# Patient Record
Sex: Female | Born: 1995 | Race: White | Hispanic: No | Marital: Single | Smoking: Current every day smoker
Health system: Southern US, Community
[De-identification: ages and names within clinical notes are randomized; demographics above are authoritative.]

## PROBLEM LIST (undated history)

## (undated) ENCOUNTER — Inpatient Hospital Stay (HOSPITAL_COMMUNITY): Payer: Self-pay

## (undated) ENCOUNTER — Inpatient Hospital Stay: Payer: Self-pay

## (undated) DIAGNOSIS — I519 Heart disease, unspecified: Secondary | ICD-10-CM

## (undated) DIAGNOSIS — Z87440 Personal history of urinary (tract) infections: Secondary | ICD-10-CM

## (undated) DIAGNOSIS — R51 Headache: Secondary | ICD-10-CM

## (undated) DIAGNOSIS — Z8619 Personal history of other infectious and parasitic diseases: Secondary | ICD-10-CM

## (undated) DIAGNOSIS — J45909 Unspecified asthma, uncomplicated: Secondary | ICD-10-CM

## (undated) DIAGNOSIS — O2300 Infections of kidney in pregnancy, unspecified trimester: Secondary | ICD-10-CM

## (undated) DIAGNOSIS — D649 Anemia, unspecified: Secondary | ICD-10-CM

## (undated) DIAGNOSIS — R519 Headache, unspecified: Secondary | ICD-10-CM

## (undated) DIAGNOSIS — R42 Dizziness and giddiness: Secondary | ICD-10-CM

## (undated) DIAGNOSIS — Z8759 Personal history of other complications of pregnancy, childbirth and the puerperium: Secondary | ICD-10-CM

---
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
## 2013-01-01 ENCOUNTER — Encounter (HOSPITAL_COMMUNITY): Payer: Self-pay | Admitting: *Deleted

## 2013-01-01 ENCOUNTER — Inpatient Hospital Stay (HOSPITAL_COMMUNITY)
Admission: AD | Admit: 2013-01-01 | Discharge: 2013-01-02 | Disposition: A | Discharge status: Home / Self Care | Payer: Medicaid Other | Source: Ambulatory Visit | Attending: Obstetrics and Gynecology | Admitting: Obstetrics and Gynecology

## 2013-01-01 ENCOUNTER — Inpatient Hospital Stay (HOSPITAL_COMMUNITY): Payer: Medicaid Other

## 2013-01-01 DIAGNOSIS — O26899 Other specified pregnancy related conditions, unspecified trimester: Secondary | ICD-10-CM

## 2013-01-01 DIAGNOSIS — K219 Gastro-esophageal reflux disease without esophagitis: Secondary | ICD-10-CM

## 2013-01-01 DIAGNOSIS — R109 Unspecified abdominal pain: Secondary | ICD-10-CM | POA: Insufficient documentation

## 2013-01-01 DIAGNOSIS — O99891 Other specified diseases and conditions complicating pregnancy: Secondary | ICD-10-CM | POA: Insufficient documentation

## 2013-01-01 DIAGNOSIS — O4703 False labor before 37 completed weeks of gestation, third trimester: Secondary | ICD-10-CM

## 2013-01-01 DIAGNOSIS — O4700 False labor before 37 completed weeks of gestation, unspecified trimester: Secondary | ICD-10-CM | POA: Insufficient documentation

## 2013-01-01 LAB — CBC
HCT: 37 % (ref 36.0–46.0)
Hemoglobin: 12.8 g/dL (ref 12.0–15.0)
MCH: 28.9 pg (ref 26.0–34.0)
MCHC: 34.6 g/dL (ref 30.0–36.0)
MCV: 83.5 fL (ref 78.0–100.0)
Platelets: 265 10*3/uL (ref 150–400)
RBC: 4.43 MIL/uL (ref 3.87–5.11)
RDW: 12.5 % (ref 11.5–15.5)
WBC: 13.6 10*3/uL — ABNORMAL HIGH (ref 4.0–10.5)

## 2013-01-01 LAB — COMPREHENSIVE METABOLIC PANEL
ALT: 11 U/L (ref 0–35)
AST: 12 U/L (ref 0–37)
Albumin: 3.2 g/dL — ABNORMAL LOW (ref 3.5–5.2)
Alkaline Phosphatase: 160 U/L — ABNORMAL HIGH (ref 39–117)
BUN: 6 mg/dL (ref 6–23)
CO2: 24 mEq/L (ref 19–32)
Calcium: 9.2 mg/dL (ref 8.4–10.5)
Chloride: 102 mEq/L (ref 96–112)
Creatinine, Ser: 0.49 mg/dL — ABNORMAL LOW (ref 0.50–1.10)
GFR calc Af Amer: >90 mL/min (ref >90)
GFR calc non Af Amer: >90 mL/min (ref >90)
Glucose, Bld: 87 mg/dL (ref 70–99)
Potassium: 3.9 mEq/L (ref 3.5–5.1)
Sodium: 136 mEq/L (ref 135–145)
Total Bilirubin: 0.3 mg/dL (ref 0.3–1.2)
Total Protein: 6.9 g/dL (ref 6.0–8.3)

## 2013-01-01 LAB — URINALYSIS, ROUTINE W REFLEX MICROSCOPIC
Hgb urine dipstick: NEGATIVE
Leukocytes, UA: NEGATIVE
Protein, ur: NEGATIVE mg/dL
Urobilinogen, UA: 0.2 mg/dL (ref 0.0–1.0)

## 2013-01-01 LAB — LIPASE, BLOOD: Lipase: 21 U/L (ref 11–59)

## 2013-01-01 LAB — POCT FERN TEST

## 2013-01-01 LAB — AMYLASE: Amylase: 39 U/L (ref 0–105)

## 2013-01-01 MED ORDER — GI COCKTAIL ~~LOC~~
30.0000 mL | Freq: Once | ORAL | Status: AC
  Administered 2013-01-01: 30 mL via ORAL
  Filled 2013-01-01: qty 30

## 2013-01-01 MED ORDER — LACTATED RINGERS IV BOLUS (SEPSIS)
1000.0000 mL | Freq: Once | INTRAVENOUS | Status: AC
  Administered 2013-01-02: 1000 mL via INTRAVENOUS

## 2013-01-01 MED ORDER — OXYCODONE-ACETAMINOPHEN 5-325 MG PO TABS: 2.0000 | ORAL_TABLET | Freq: Once | ORAL | Status: DC

## 2013-01-01 MED ORDER — NIFEDIPINE 10 MG PO CAPS
10.0000 mg | ORAL_CAPSULE | ORAL | Status: DC | PRN
  Administered 2013-01-01 (×3): 10 mg via ORAL
  Filled 2013-01-01 (×3): qty 1

## 2013-01-01 MED ORDER — TRAMADOL HCL 50 MG PO TABS
100.0000 mg | ORAL_TABLET | Freq: Once | ORAL | Status: AC | PRN
  Administered 2013-01-01: 100 mg via ORAL
  Filled 2013-01-01: qty 2

## 2013-01-01 NOTE — MAU Provider Note (Signed)
Chief Complaint:  Contractions  First Provider Initiated Contact with Patient 01/01/13 2106     HPI: Ruth Gross is a 17 y.o. G2P0010 at [redacted]w[redacted]d who presents to maternity admissions reporting worsening upper abd pain over the past week, 2-3 UC's per hour and leaking small amounts of thin, white fluid x2 days. Seen in MAU 12/25/2012 for contractions and upper abdominal pain. Cervix closed. Fetal fibronectin positive. Wet prep and GC Chlamydia negative. Betamethasone x2. Prescribed Procardia which she has not taken for over a day. Essentially normal CBC and CMET. No evidence of preeclampsia. Good fetal movement.   Past Medical History: Past Medical History  Diagnosis Date  . No pertinent past medical history   . Anemia     Past obstetric history: OB History  Gravida Para Term Preterm AB SAB TAB Ectopic Multiple Living  2    1 1     0    # Outcome Date GA Lbr Len/2nd Weight Sex Delivery Anes PTL Lv  2 CUR           1 SAB 04/09/12              Past Surgical History: Past Surgical History  Procedure Laterality Date  . No past surgeries    . Dilation and evacuation  04/10/2012    Procedure: DILATATION AND EVACUATION;  Surgeon: Mickel Baas, MD;  Location: [facility];  Service: Gynecology;  Laterality: N/A;  . Dilation and evacuation N/A 06/29/2012    Procedure: DILATATION AND EVACUATION;  Surgeon: Mickel Baas, MD;  Location: [facility];  Service: Gynecology;  Laterality: N/A;     Family History: Family History  Problem Relation Age of Onset  . Other Neg Hx   . Diabetes Father   . Diabetes Sister     Social History: History  Substance Use Topics  . Smoking status: Never Smoker   . Smokeless tobacco: Not on file  . Alcohol Use: No    Allergies: No Known Allergies  Meds:  Prescriptions prior to admission  Medication Sig Dispense Refill  . ferrous sulfate 325 (65 FE) MG tablet Take 1 tablet (325 mg total) by mouth 2 (two) times daily.  60 tablet  5  . NIFEdipine  (PROCARDIA) 10 MG capsule Take 1 capsule (10 mg total) by mouth 3 (three) times daily.  90 capsule  1  . Prenatal Vit-Fe Fumarate-FA (PRENATAL MULTIVITAMIN) TABS Take 1 tablet by mouth at bedtime.        ROS: Positive for contractions, epigastric pain, leaking, urinary frequency and nausea. Negative for fever, chills, headache, vision changes, uterine tenderness, vaginal bleeding, vaginal odor, dysuria, urgency, flank pain, vomiting, diarrhea or constipation. Last bowel movement today. Last intercourse more than one week ago.  Physical Exam  Blood pressure 120/69, pulse 90, temperature 98.9 F (37.2 C), temperature source Oral, resp. rate 18, height 5\' 3"  (1.6 m), weight 81.285 kg (179 lb 3.2 oz), last menstrual period 05/10/2012, SpO2 100.00%. GENERAL: Well-developed, well-nourished female in mild distress.  HEENT: normocephalic HEART: normal rate RESP: normal effort ABDOMEN: Soft, moderate tenderness across upper abd. Gravid appropriate for gestational age. Uterus NT.  EXTREMITIES: Nontender, no edema NEURO: alert and oriented SPECULUM EXAM: NEFG, physiologic discharge, no odor, neg pooling, no blood, cervix clean   Dilation: Fingertip Effacement (%): 50 Cervical Position: Middle Station: Ballotable Exam by:: [employer] CNM   FHT:  Baseline 150 , moderate variability, accelerations present, no decelerations Contractions: Rare initially, but increased to q 3-5 mins, mild-moderate  Labs: Results for orders placed during the hospital encounter of 01/01/13 (from the past 24 hour(s))  URINALYSIS, ROUTINE W REFLEX MICROSCOPIC     Status: None   Collection Time    01/01/13  8:26 PM      Result Value Range   Color, Urine YELLOW  YELLOW   APPearance CLEAR  CLEAR   Specific Gravity, Urine 1.020  1.005 - 1.030   pH 6.0  5.0 - 8.0   Glucose, UA NEGATIVE  NEGATIVE mg/dL   Hgb urine dipstick NEGATIVE  NEGATIVE   Bilirubin Urine NEGATIVE  NEGATIVE   Ketones, ur NEGATIVE  NEGATIVE  mg/dL   Protein, ur NEGATIVE  NEGATIVE mg/dL   Urobilinogen, UA 0.2  0.0 - 1.0 mg/dL   Nitrite NEGATIVE  NEGATIVE   Leukocytes, UA NEGATIVE  NEGATIVE  CBC     Status: Abnormal   Collection Time    01/01/13 10:12 PM      Result Value Range   WBC 13.6 (*) 4.0 - 10.5 K/uL   RBC 4.43  3.87 - 5.11 MIL/uL   Hemoglobin 12.8  12.0 - 15.0 g/dL   HCT 45.4  09.8 - 11.9 %   MCV 83.5  78.0 - 100.0 fL   MCH 28.9  26.0 - 34.0 pg   MCHC 34.6  30.0 - 36.0 g/dL   RDW 14.7  82.9 - 56.2 %   Platelets 265  150 - 400 K/uL  COMPREHENSIVE METABOLIC PANEL     Status: Abnormal   Collection Time    01/01/13 10:12 PM      Result Value Range   Sodium 136  135 - 145 mEq/L   Potassium 3.9  3.5 - 5.1 mEq/L   Chloride 102  96 - 112 mEq/L   CO2 24  19 - 32 mEq/L   Glucose, Bld 87  70 - 99 mg/dL   BUN 6  6 - 23 mg/dL   Creatinine, Ser 1.30 (*) 0.50 - 1.10 mg/dL   Calcium 9.2  8.4 - 86.5 mg/dL   Total Protein 6.9  6.0 - 8.3 g/dL   Albumin 3.2 (*) 3.5 - 5.2 g/dL   AST 12  0 - 37 U/L   ALT 11  0 - 35 U/L   Alkaline Phosphatase 160 (*) 39 - 117 U/L   Total Bilirubin 0.3  0.3 - 1.2 mg/dL   GFR calc non Af Amer >90  >90 mL/min   GFR calc Af Amer >90  >90 mL/min  POCT FERN TEST     Status: Normal   Collection Time    01/01/13 10:55 PM      Result Value Range   POCT Fern Test Negative      Imaging:  US Abdomen Limited Ruq  01/01/2013   CLINICAL DATA:  Epigastric pain.  Thirty-three weeks pregnant.  EXAM: US ABDOMEN LIMITED - RIGHT UPPER QUADRANT  COMPARISON:  None.  FINDINGS: Gallbladder  No gallstones or wall thickening visualized. No sonographic Murphy sign noted.  Common bile duct  Diameter: 1.6 mm, normal.  Liver:  No focal lesion identified. Within normal limits in parenchymal echogenicity.  IMPRESSION: Normal ultrasound appearance of the visualized right upper quadrant organs.   Electronically Signed   By: Burman Nieves M.D.   On: 01/01/2013 22:58   MAU Course: Procardia given. Right upper quadrant  ultrasound ordered, CBC and CMET ordered per consult with Dr. Tenny Craw.  Normal right upper quadrant ultrasound. GI cocktail ordered.  2343: No improvement in upper  abd pain after GI cocktail. Minimal decrease in pain or contractions after 3 Procardia doses, but significant increase in frequency. No cervical change. Will order IV fluids and Percocet.  Care of pt turned over to [facility], CNM  [place], [place] 01/01/2013 8:57 PM  Percocet and GI cocktail provided decrease in pain, pain not completely resolved.  Cervix rechecked--unchanged during MAU visit.  A: 1. Preterm contractions, third trimester   2. Acid reflux   3. Abdominal pain in pregnancy    P: Consult Dr Tenny Craw D/C home with PTL precautions Percocet 5/325, take 1-2 tabs Q 4 hours PRN, x10 tabs Zantac 150 mg OTC BID Keep appointment in office tomorrow Return to MAU as needed  Sharen Counter Certified Nurse-Midwife

## 2013-01-01 NOTE — MAU Note (Signed)
2-3 painful contractions per hour since last night. Denies vaginal bleeding. Has been leaking fluid that is sometime clear & sometime yellow, switches between watery and mucous like; x 2 days. Positive fetal movement. More than a week since last intercourse.

## 2013-01-02 DIAGNOSIS — O479 False labor, unspecified: Secondary | ICD-10-CM

## 2013-01-02 LAB — DIFFERENTIAL
Eosinophils Absolute: 0.1 10*3/uL (ref 0.0–0.7)
Eosinophils Relative: 1 % (ref 0–5)
Lymphocytes Relative: 20 % (ref 12–46)
Lymphs Abs: 2.7 10*3/uL (ref 0.7–4.0)
Monocytes Absolute: 0.7 10*3/uL (ref 0.1–1.0)
Monocytes Relative: 5 % (ref 3–12)

## 2013-01-02 MED ORDER — OXYCODONE-ACETAMINOPHEN 5-325 MG PO TABS
2.0000 | ORAL_TABLET | Freq: Once | ORAL | Status: AC
  Administered 2013-01-02: 2 via ORAL
  Filled 2013-01-02: qty 2

## 2013-01-02 MED ORDER — OXYCODONE-ACETAMINOPHEN 5-325 MG PO TABS: 2.0000 | ORAL_TABLET | Freq: Once | ORAL | Status: DC

## 2013-01-03 ENCOUNTER — Telehealth: Payer: Self-pay | Admitting: *Deleted

## 2013-01-03 MED ORDER — OXYCODONE-ACETAMINOPHEN 5-325 MG PO TABS: ORAL_TABLET | ORAL | Status: DC

## 2013-01-03 NOTE — Telephone Encounter (Signed)
Message left by [facility] pharmacist stating he has a question about a prescription written by Sharen Counter CNM on 01/02/13. I returned the call and he stated that the Rx for Percocet is written to take 2 tabs once, yet the quantity is #10 tabs. Per Lisa's note, she wanted pt to take 1-2 tabs q4h prn. I provided this clarification and updated the Rx in pt's EMR.

## 2013-02-09 ENCOUNTER — Inpatient Hospital Stay (HOSPITAL_COMMUNITY)
Admission: AD | Admit: 2013-02-09 | Discharge: 2013-02-13 | DRG: 766 | Disposition: A | Discharge status: Home / Self Care | Payer: Medicaid Other | Source: Ambulatory Visit | Attending: Obstetrics and Gynecology | Admitting: Obstetrics and Gynecology

## 2013-02-09 ENCOUNTER — Encounter (HOSPITAL_COMMUNITY): Payer: Self-pay | Admitting: *Deleted

## 2013-02-09 DIAGNOSIS — Z3480 Encounter for supervision of other normal pregnancy, unspecified trimester: Secondary | ICD-10-CM

## 2013-02-09 DIAGNOSIS — O34219 Maternal care for unspecified type scar from previous cesarean delivery: Secondary | ICD-10-CM

## 2013-02-09 DIAGNOSIS — O4290 Premature rupture of membranes, unspecified as to length of time between rupture and onset of labor, unspecified weeks of gestation: Secondary | ICD-10-CM | POA: Diagnosis present

## 2013-02-09 DIAGNOSIS — Z3490 Encounter for supervision of normal pregnancy, unspecified, unspecified trimester: Secondary | ICD-10-CM

## 2013-02-09 LAB — CBC
Hemoglobin: 13.1 g/dL (ref 12.0–15.0)
MCV: 82.1 fL (ref 78.0–100.0)
Platelets: 218 10*3/uL (ref 150–400)
RBC: 4.64 MIL/uL (ref 3.87–5.11)
WBC: 9.9 10*3/uL (ref 4.0–10.5)

## 2013-02-09 LAB — OB RESULTS CONSOLE GC/CHLAMYDIA
Chlamydia: NEGATIVE
Gonorrhea: NEGATIVE

## 2013-02-09 LAB — OB RESULTS CONSOLE RUBELLA ANTIBODY, IGM: Rubella: IMMUNE

## 2013-02-09 LAB — OB RESULTS CONSOLE HIV ANTIBODY (ROUTINE TESTING): HIV: NONREACTIVE

## 2013-02-09 LAB — OB RESULTS CONSOLE RPR: RPR: REACTIVE

## 2013-02-09 LAB — OB RESULTS CONSOLE HEPATITIS B SURFACE ANTIGEN: Hepatitis B Surface Ag: NEGATIVE

## 2013-02-09 MED ORDER — OXYTOCIN 40 UNITS IN LACTATED RINGERS INFUSION - SIMPLE MED
62.5000 mL/h | INTRAVENOUS | Status: DC
  Filled 2013-02-09: qty 1000

## 2013-02-09 MED ORDER — FLEET ENEMA 7-19 GM/118ML RE ENEM: 1.0000 | ENEMA | RECTAL | Status: DC | PRN

## 2013-02-09 MED ORDER — ONDANSETRON HCL 4 MG/2ML IJ SOLN
4.0000 mg | Freq: Four times a day (QID) | INTRAMUSCULAR | Status: DC | PRN
  Administered 2013-02-10: 4 mg via INTRAVENOUS
  Filled 2013-02-09: qty 2

## 2013-02-09 MED ORDER — DIPHENHYDRAMINE HCL 50 MG/ML IJ SOLN: 12.5000 mg | INTRAMUSCULAR | Status: DC | PRN

## 2013-02-09 MED ORDER — CITRIC ACID-SODIUM CITRATE 334-500 MG/5ML PO SOLN
30.0000 mL | ORAL | Status: DC | PRN
  Administered 2013-02-10: 30 mL via ORAL
  Filled 2013-02-09: qty 15

## 2013-02-09 MED ORDER — OXYTOCIN BOLUS FROM INFUSION: 500.0000 mL | INTRAVENOUS | Status: DC

## 2013-02-09 MED ORDER — EPHEDRINE 5 MG/ML INJ: 10.0000 mg | INTRAVENOUS | Status: DC | PRN

## 2013-02-09 MED ORDER — PHENYLEPHRINE 40 MCG/ML (10ML) SYRINGE FOR IV PUSH (FOR BLOOD PRESSURE SUPPORT): 80.0000 ug | PREFILLED_SYRINGE | INTRAVENOUS | Status: DC | PRN

## 2013-02-09 MED ORDER — LACTATED RINGERS IV SOLN
INTRAVENOUS | Status: DC
  Administered 2013-02-09 – 2013-02-10 (×6): via INTRAVENOUS

## 2013-02-09 MED ORDER — FENTANYL 2.5 MCG/ML BUPIVACAINE 1/10 % EPIDURAL INFUSION (WH - ANES)
14.0000 mL/h | INTRAMUSCULAR | Status: DC | PRN
  Administered 2013-02-10: 14 mL/h via EPIDURAL
  Filled 2013-02-09: qty 125

## 2013-02-09 MED ORDER — LACTATED RINGERS IV SOLN: 500.0000 mL | INTRAVENOUS | Status: DC | PRN

## 2013-02-09 MED ORDER — EPHEDRINE 5 MG/ML INJ
10.0000 mg | INTRAVENOUS | Status: DC | PRN
  Filled 2013-02-09: qty 4

## 2013-02-09 MED ORDER — LIDOCAINE HCL (PF) 1 % IJ SOLN: 30.0000 mL | INTRAMUSCULAR | Status: DC | PRN

## 2013-02-09 MED ORDER — OXYTOCIN 40 UNITS IN LACTATED RINGERS INFUSION - SIMPLE MED
1.0000 m[IU]/min | INTRAVENOUS | Status: DC
  Administered 2013-02-09 – 2013-02-10 (×2): 2 m[IU]/min via INTRAVENOUS

## 2013-02-09 MED ORDER — ACETAMINOPHEN 325 MG PO TABS: 650.0000 mg | ORAL_TABLET | ORAL | Status: DC | PRN

## 2013-02-09 MED ORDER — TERBUTALINE SULFATE 1 MG/ML IJ SOLN: 0.2500 mg | Freq: Once | INTRAMUSCULAR | Status: AC | PRN

## 2013-02-09 MED ORDER — LACTATED RINGERS IV SOLN: 500.0000 mL | Freq: Once | INTRAVENOUS | Status: DC

## 2013-02-09 MED ORDER — IBUPROFEN 600 MG PO TABS: 600.0000 mg | ORAL_TABLET | Freq: Four times a day (QID) | ORAL | Status: DC | PRN

## 2013-02-09 MED ORDER — OXYCODONE-ACETAMINOPHEN 5-325 MG PO TABS
1.0000 | ORAL_TABLET | ORAL | Status: DC | PRN
  Administered 2013-02-10: 1 via ORAL
  Filled 2013-02-09: qty 1

## 2013-02-09 MED ORDER — PHENYLEPHRINE 40 MCG/ML (10ML) SYRINGE FOR IV PUSH (FOR BLOOD PRESSURE SUPPORT)
80.0000 ug | PREFILLED_SYRINGE | INTRAVENOUS | Status: DC | PRN
  Filled 2013-02-09: qty 10

## 2013-02-09 NOTE — MAU Note (Signed)
Pt reprots she had a gush of fluid at 1345 today clear fluid. Pt reports some ctx about 8-9 min. Good fetal movement.

## 2013-02-10 ENCOUNTER — Encounter (HOSPITAL_COMMUNITY): Payer: Medicaid Other | Admitting: Anesthesiology

## 2013-02-10 ENCOUNTER — Encounter (HOSPITAL_COMMUNITY): Payer: Self-pay | Admitting: *Deleted

## 2013-02-10 ENCOUNTER — Encounter (HOSPITAL_COMMUNITY)
Admission: AD | Disposition: A | Discharge status: Home / Self Care | Payer: Self-pay | Source: Ambulatory Visit | Attending: Obstetrics and Gynecology

## 2013-02-10 ENCOUNTER — Inpatient Hospital Stay (HOSPITAL_COMMUNITY): Payer: Medicaid Other | Admitting: Anesthesiology

## 2013-02-10 ENCOUNTER — Inpatient Hospital Stay (HOSPITAL_COMMUNITY): Payer: Medicaid Other

## 2013-02-10 LAB — RPR: RPR Ser Ql: NONREACTIVE

## 2013-02-10 SURGERY — Surgical Case
Anesthesia: Epidural | Site: Abdomen | Wound class: Clean Contaminated

## 2013-02-10 MED ORDER — SODIUM BICARBONATE 8.4 % IV SOLN
INTRAVENOUS | Status: AC
  Filled 2013-02-10: qty 50

## 2013-02-10 MED ORDER — OXYTOCIN 10 UNIT/ML IJ SOLN
40.0000 [IU] | INTRAVENOUS | Status: DC | PRN
  Administered 2013-02-10: 40 [IU] via INTRAVENOUS

## 2013-02-10 MED ORDER — OXYTOCIN 10 UNIT/ML IJ SOLN
INTRAMUSCULAR | Status: AC
  Filled 2013-02-10: qty 4

## 2013-02-10 MED ORDER — HYDROMORPHONE HCL PF 1 MG/ML IJ SOLN
INTRAMUSCULAR | Status: DC | PRN
  Administered 2013-02-10 (×2): 0.5 mg via INTRAVENOUS

## 2013-02-10 MED ORDER — HYDROMORPHONE HCL PF 1 MG/ML IJ SOLN
0.2500 mg | INTRAMUSCULAR | Status: DC | PRN
  Administered 2013-02-11: 0.5 mg via INTRAVENOUS

## 2013-02-10 MED ORDER — MEPERIDINE HCL 25 MG/ML IJ SOLN
INTRAMUSCULAR | Status: DC | PRN
  Administered 2013-02-10: 25 mg via INTRAVENOUS

## 2013-02-10 MED ORDER — LIDOCAINE-EPINEPHRINE (PF) 2 %-1:200000 IJ SOLN
INTRAMUSCULAR | Status: AC
  Filled 2013-02-10: qty 20

## 2013-02-10 MED ORDER — ONDANSETRON HCL 4 MG/2ML IJ SOLN
INTRAMUSCULAR | Status: DC | PRN
  Administered 2013-02-10: 4 mg via INTRAVENOUS

## 2013-02-10 MED ORDER — NALBUPHINE SYRINGE 5 MG/0.5 ML
10.0000 mg | INJECTION | INTRAMUSCULAR | Status: DC | PRN
  Administered 2013-02-10: 10 mg via INTRAVENOUS
  Filled 2013-02-10: qty 1

## 2013-02-10 MED ORDER — HYDROMORPHONE HCL PF 1 MG/ML IJ SOLN
INTRAMUSCULAR | Status: AC
  Filled 2013-02-10: qty 1

## 2013-02-10 MED ORDER — SCOPOLAMINE 1 MG/3DAYS TD PT72
MEDICATED_PATCH | TRANSDERMAL | Status: AC
  Administered 2013-02-11: 1.5 mg via TRANSDERMAL
  Filled 2013-02-10: qty 1

## 2013-02-10 MED ORDER — KETAMINE HCL 10 MG/ML IJ SOLN
INTRAMUSCULAR | Status: AC
  Filled 2013-02-10: qty 1

## 2013-02-10 MED ORDER — MORPHINE SULFATE 0.5 MG/ML IJ SOLN
INTRAMUSCULAR | Status: AC
  Filled 2013-02-10: qty 10

## 2013-02-10 MED ORDER — ONDANSETRON HCL 4 MG/2ML IJ SOLN
INTRAMUSCULAR | Status: AC
  Filled 2013-02-10: qty 4

## 2013-02-10 MED ORDER — CHLOROPROCAINE HCL 3 % IJ SOLN
INTRAMUSCULAR | Status: AC
  Filled 2013-02-10: qty 20

## 2013-02-10 MED ORDER — PHENYLEPHRINE 40 MCG/ML (10ML) SYRINGE FOR IV PUSH (FOR BLOOD PRESSURE SUPPORT)
PREFILLED_SYRINGE | INTRAVENOUS | Status: AC
  Filled 2013-02-10: qty 5

## 2013-02-10 MED ORDER — SODIUM BICARBONATE 8.4 % IV SOLN
INTRAVENOUS | Status: DC | PRN
  Administered 2013-02-10 (×7): 5 mL via EPIDURAL

## 2013-02-10 MED ORDER — MEPERIDINE HCL 25 MG/ML IJ SOLN
INTRAMUSCULAR | Status: AC
  Filled 2013-02-10: qty 1

## 2013-02-10 MED ORDER — CEFAZOLIN SODIUM-DEXTROSE 2-3 GM-% IV SOLR
INTRAVENOUS | Status: DC | PRN
  Administered 2013-02-10: 2 g via INTRAVENOUS

## 2013-02-10 MED ORDER — BUPIVACAINE HCL (PF) 0.25 % IJ SOLN
INTRAMUSCULAR | Status: AC
  Filled 2013-02-10: qty 10

## 2013-02-10 MED ORDER — MORPHINE SULFATE (PF) 0.5 MG/ML IJ SOLN
INTRAMUSCULAR | Status: DC | PRN
  Administered 2013-02-10: 4 mg via EPIDURAL

## 2013-02-10 MED ORDER — LIDOCAINE HCL (PF) 1 % IJ SOLN
INTRAMUSCULAR | Status: DC | PRN
  Administered 2013-02-10 (×4): 4 mL

## 2013-02-10 MED ORDER — MEPERIDINE HCL 25 MG/ML IJ SOLN: 6.2500 mg | INTRAMUSCULAR | Status: DC | PRN

## 2013-02-10 MED ORDER — NALBUPHINE SYRINGE 5 MG/0.5 ML
INJECTION | INTRAMUSCULAR | Status: AC
  Filled 2013-02-10: qty 1

## 2013-02-10 MED ORDER — PHENYLEPHRINE HCL 10 MG/ML IJ SOLN
INTRAMUSCULAR | Status: DC | PRN
  Administered 2013-02-10: 80 ug via INTRAVENOUS

## 2013-02-10 MED ORDER — MORPHINE SULFATE (PF) 0.5 MG/ML IJ SOLN
INTRAMUSCULAR | Status: DC | PRN
  Administered 2013-02-10: 1 mg via INTRAVENOUS

## 2013-02-10 MED ORDER — CEFAZOLIN SODIUM-DEXTROSE 2-3 GM-% IV SOLR
INTRAVENOUS | Status: AC
  Filled 2013-02-10: qty 50

## 2013-02-10 MED ORDER — SCOPOLAMINE 1 MG/3DAYS TD PT72
1.0000 | MEDICATED_PATCH | Freq: Once | TRANSDERMAL | Status: DC
  Administered 2013-02-11: 1.5 mg via TRANSDERMAL

## 2013-02-10 SURGICAL SUPPLY — 29 items
CLAMP CORD UMBIL (MISCELLANEOUS) IMPLANT
CLOTH BEACON ORANGE TIMEOUT ST (SAFETY) ×2 IMPLANT
CONTAINER PREFILL 10% NBF 15ML (MISCELLANEOUS) IMPLANT
DRAPE LG THREE QUARTER DISP (DRAPES) IMPLANT
DRSG OPSITE POSTOP 4X10 (GAUZE/BANDAGES/DRESSINGS) ×2 IMPLANT
DURAPREP 26ML APPLICATOR (WOUND CARE) ×2 IMPLANT
ELECT REM PT RETURN 9FT ADLT (ELECTROSURGICAL) ×2
ELECTRODE REM PT RTRN 9FT ADLT (ELECTROSURGICAL) ×1 IMPLANT
EXTRACTOR VACUUM M CUP 4 TUBE (SUCTIONS) IMPLANT
GLOVE ECLIPSE 7.0 STRL STRAW (GLOVE) ×4 IMPLANT
GOWN PREVENTION PLUS XLARGE (GOWN DISPOSABLE) ×6 IMPLANT
GOWN STRL REIN XL XLG (GOWN DISPOSABLE) ×4 IMPLANT
KIT ABG SYR 3ML LUER SLIP (SYRINGE) IMPLANT
NEEDLE HYPO 25X5/8 SAFETYGLIDE (NEEDLE) IMPLANT
NS IRRIG 1000ML POUR BTL (IV SOLUTION) ×2 IMPLANT
PACK C SECTION WH (CUSTOM PROCEDURE TRAY) ×2 IMPLANT
PAD OB MATERNITY 4.3X12.25 (PERSONAL CARE ITEMS) ×2 IMPLANT
RETRACTOR WND ALEXIS 25 LRG (MISCELLANEOUS) IMPLANT
RETRACTOR WOUND ALXS 34CM XLRG (MISCELLANEOUS) IMPLANT
RTRCTR WOUND ALEXIS 25CM LRG (MISCELLANEOUS)
RTRCTR WOUND ALEXIS 34CM XLRG (MISCELLANEOUS)
STAPLER VISISTAT 35W (STAPLE) IMPLANT
SUT MNCRL 0 VIOLET CTX 36 (SUTURE) ×3 IMPLANT
SUT MON AB 2-0 CT1 27 (SUTURE) ×4 IMPLANT
SUT MONOCRYL 0 CTX 36 (SUTURE) ×3
SUT PLAIN 0 NONE (SUTURE) IMPLANT
TOWEL OR 17X24 6PK STRL BLUE (TOWEL DISPOSABLE) ×2 IMPLANT
TRAY FOLEY CATH 14FR (SET/KITS/TRAYS/PACK) IMPLANT
WATER STERILE IRR 1000ML POUR (IV SOLUTION) ×2 IMPLANT

## 2013-02-10 NOTE — Preoperative (Signed)
Beta Blockers   Reason not to administer Beta Blockers:Not Applicable 

## 2013-02-10 NOTE — Progress Notes (Signed)
Dr. Dareen Piano called notified of pt SVE unchanged and cervix appears swollen, will come in to discuss c-section with pt due to failure to progress

## 2013-02-10 NOTE — Anesthesia Preprocedure Evaluation (Signed)

## 2013-02-10 NOTE — Progress Notes (Signed)
Dr. Dareen Piano at bedside, discussing with pt plan of care, explaining risks and benefits of c-section, pt verbalizes understanding and consents to c-section, FOB at bedside

## 2013-02-10 NOTE — Anesthesia Procedure Notes (Signed)
Epidural Patient location during procedure: OB Start time: 02/10/2013 6:28 PM  Staffing Performed by: anesthesiologist   Preanesthetic Checklist Completed: patient identified, site marked, surgical consent, pre-op evaluation, timeout performed, IV checked, risks and benefits discussed and monitors and equipment checked  Epidural Patient position: sitting Prep: site prepped and draped and DuraPrep Patient monitoring: continuous pulse ox and blood pressure Approach: midline Injection technique: LOR air  Needle:  Needle type: Tuohy  Needle gauge: 17 G Needle length: 9 cm and 9 Needle insertion depth: 6 cm Catheter type: closed end flexible Catheter size: 19 Gauge Catheter at skin depth: 11 cm Test dose: negative  Assessment Events: blood not aspirated, injection not painful, no injection resistance, negative IV test and no paresthesia  Additional Notes Discussed risk of headache, infection, bleeding, nerve injury and failed or incomplete block.  Patient voices understanding and wishes to proceed.  Epidural placed easily on first attempt.  No paresthesia.  Patient tolerated procedure well with no apparent complications.  Jasmine December, MDReason for block:procedure for pain

## 2013-02-10 NOTE — Progress Notes (Signed)
Dr. Dareen Piano notified of SVE and uc pattern, orders to recheck pt in 2 hrs and call with SVE

## 2013-02-10 NOTE — H&P (Signed)
Pt presents to the ER c/o PROM. On admission pt was reported to have + pool, +fern. She was admitted and started on Pit. Pt had no significant contractions with PIT, An u/s was performed this am to confirm ROM. The AFI was 1. Her cx is 20/1-2/-3. PNC was uncomplicated. PMHx: See hollister PE: VSSAF         HEENT-- wnl         ABd- gravid, non tender         FHTs- reactive IMP/ IUP at 39 weeks with PROM Plan/ Admit          Pit aug

## 2013-02-11 ENCOUNTER — Encounter (HOSPITAL_COMMUNITY): Payer: Self-pay | Admitting: *Deleted

## 2013-02-11 DIAGNOSIS — Z3480 Encounter for supervision of other normal pregnancy, unspecified trimester: Secondary | ICD-10-CM

## 2013-02-11 LAB — CBC
HCT: 31.7 % — ABNORMAL LOW (ref 36.0–46.0)
Hemoglobin: 10.8 g/dL — ABNORMAL LOW (ref 12.0–15.0)
MCH: 28.2 pg (ref 26.0–34.0)
MCV: 83.4 fL (ref 78.0–100.0)
RBC: 3.8 MIL/uL — ABNORMAL LOW (ref 3.87–5.11)
RDW: 13.5 % (ref 11.5–15.5)
WBC: 13.3 10*3/uL — ABNORMAL HIGH (ref 4.0–10.5)

## 2013-02-11 MED ORDER — DIBUCAINE 1 % RE OINT: 1.0000 "application " | TOPICAL_OINTMENT | RECTAL | Status: DC | PRN

## 2013-02-11 MED ORDER — SODIUM CHLORIDE 0.9 % IJ SOLN: 3.0000 mL | INTRAMUSCULAR | Status: DC | PRN

## 2013-02-11 MED ORDER — ZOLPIDEM TARTRATE 5 MG PO TABS: 5.0000 mg | ORAL_TABLET | Freq: Every evening | ORAL | Status: DC | PRN

## 2013-02-11 MED ORDER — OXYTOCIN 40 UNITS IN LACTATED RINGERS INFUSION - SIMPLE MED: 62.5000 mL/h | INTRAVENOUS | Status: AC

## 2013-02-11 MED ORDER — KETOROLAC TROMETHAMINE 30 MG/ML IJ SOLN
30.0000 mg | Freq: Once | INTRAMUSCULAR | Status: AC
  Administered 2013-02-11: 30 mg via INTRAMUSCULAR

## 2013-02-11 MED ORDER — DIPHENHYDRAMINE HCL 25 MG PO CAPS: 25.0000 mg | ORAL_CAPSULE | ORAL | Status: DC | PRN

## 2013-02-11 MED ORDER — TETANUS-DIPHTH-ACELL PERTUSSIS 5-2.5-18.5 LF-MCG/0.5 IM SUSP: 0.5000 mL | Freq: Once | INTRAMUSCULAR | Status: DC

## 2013-02-11 MED ORDER — LACTATED RINGERS IV SOLN
INTRAVENOUS | Status: DC
  Administered 2013-02-11: 05:00:00 via INTRAVENOUS

## 2013-02-11 MED ORDER — SIMETHICONE 80 MG PO CHEW
80.0000 mg | CHEWABLE_TABLET | ORAL | Status: DC
  Administered 2013-02-12 (×2): 80 mg via ORAL
  Filled 2013-02-11 (×2): qty 1

## 2013-02-11 MED ORDER — ONDANSETRON HCL 4 MG/2ML IJ SOLN: 4.0000 mg | INTRAMUSCULAR | Status: DC | PRN

## 2013-02-11 MED ORDER — NALBUPHINE SYRINGE 5 MG/0.5 ML
5.0000 mg | INJECTION | INTRAMUSCULAR | Status: DC | PRN
  Filled 2013-02-11: qty 1

## 2013-02-11 MED ORDER — IBUPROFEN 600 MG PO TABS
600.0000 mg | ORAL_TABLET | Freq: Four times a day (QID) | ORAL | Status: DC
  Administered 2013-02-11 – 2013-02-13 (×9): 600 mg via ORAL
  Filled 2013-02-11 (×9): qty 1

## 2013-02-11 MED ORDER — ONDANSETRON HCL 4 MG/2ML IJ SOLN: 4.0000 mg | Freq: Three times a day (TID) | INTRAMUSCULAR | Status: DC | PRN

## 2013-02-11 MED ORDER — PRENATAL MULTIVITAMIN CH
1.0000 | ORAL_TABLET | Freq: Every day | ORAL | Status: DC
Start: 2013-02-11 — End: 2013-02-11

## 2013-02-11 MED ORDER — PRENATAL MULTIVITAMIN CH
1.0000 | ORAL_TABLET | Freq: Every day | ORAL | Status: DC
  Administered 2013-02-11 – 2013-02-12 (×2): 1 via ORAL
  Filled 2013-02-11 (×2): qty 1

## 2013-02-11 MED ORDER — DIPHENHYDRAMINE HCL 50 MG/ML IJ SOLN: 25.0000 mg | INTRAMUSCULAR | Status: DC | PRN

## 2013-02-11 MED ORDER — HYDROMORPHONE HCL PF 1 MG/ML IJ SOLN
INTRAMUSCULAR | Status: AC
  Filled 2013-02-11: qty 1

## 2013-02-11 MED ORDER — OXYCODONE-ACETAMINOPHEN 5-325 MG PO TABS
1.0000 | ORAL_TABLET | ORAL | Status: DC | PRN
  Administered 2013-02-11 – 2013-02-13 (×9): 1 via ORAL
  Filled 2013-02-11 (×9): qty 1

## 2013-02-11 MED ORDER — METOCLOPRAMIDE HCL 5 MG/ML IJ SOLN: 10.0000 mg | Freq: Three times a day (TID) | INTRAMUSCULAR | Status: DC | PRN

## 2013-02-11 MED ORDER — NALOXONE HCL 1 MG/ML IJ SOLN
1.0000 ug/kg/h | INTRAVENOUS | Status: DC | PRN
  Filled 2013-02-11: qty 2

## 2013-02-11 MED ORDER — WITCH HAZEL-GLYCERIN EX PADS: 1.0000 "application " | MEDICATED_PAD | CUTANEOUS | Status: DC | PRN

## 2013-02-11 MED ORDER — MEASLES, MUMPS & RUBELLA VAC ~~LOC~~ INJ
0.5000 mL | INJECTION | Freq: Once | SUBCUTANEOUS | Status: DC
  Filled 2013-02-11: qty 0.5

## 2013-02-11 MED ORDER — SENNOSIDES-DOCUSATE SODIUM 8.6-50 MG PO TABS
2.0000 | ORAL_TABLET | ORAL | Status: DC
  Administered 2013-02-12 (×2): 2 via ORAL
  Filled 2013-02-11 (×2): qty 2

## 2013-02-11 MED ORDER — KETOROLAC TROMETHAMINE 30 MG/ML IJ SOLN
INTRAMUSCULAR | Status: AC
  Filled 2013-02-11: qty 1

## 2013-02-11 MED ORDER — INFLUENZA VAC SPLIT QUAD 0.5 ML IM SUSP: 0.5000 mL | INTRAMUSCULAR | Status: DC

## 2013-02-11 MED ORDER — ONDANSETRON HCL 4 MG PO TABS: 4.0000 mg | ORAL_TABLET | ORAL | Status: DC | PRN

## 2013-02-11 MED ORDER — DIPHENHYDRAMINE HCL 50 MG/ML IJ SOLN: 12.5000 mg | INTRAMUSCULAR | Status: DC | PRN

## 2013-02-11 MED ORDER — NALOXONE HCL 0.4 MG/ML IJ SOLN: 0.4000 mg | INTRAMUSCULAR | Status: DC | PRN

## 2013-02-11 NOTE — Anesthesia Postprocedure Evaluation (Signed)
  Anesthesia Post-op Note  Patient: [name]  Procedure(s) Performed: Procedure(s): CESAREAN SECTION (N/A)  Patient is awake, responsive, moving her legs, and has signs of resolution of her numbness. Pain and nausea are reasonably well controlled. Vital signs are stable and clinically acceptable. Oxygen saturation is clinically acceptable. There are no apparent anesthetic complications at this time. Patient is ready for discharge.

## 2013-02-11 NOTE — Lactation Note (Signed)
This note was copied from the chart of Ruth Gross. Lactation Consultation Note MBU RN requesting comfort gels due to moms soreness, reports latch to be ok now.  Mom reports mild nipple pain, encouraged to express colostrum and rub into nipples.   Right nipple pink, comfort gels given and instructed on use.  Mom to call for assist if having painful latches.   Patient Name: Ruth Lucianne Smestad IONGE'X Date: 02/11/2013     Maternal Data    Feeding Feeding Type: Breast Fed Length of feed: 3 min  LATCH Score/Interventions Latch: Grasps breast easily, tongue down, lips flanged, rhythmical sucking. Intervention(s): Breast compression;Breast massage;Assist with latch;Adjust position  Audible Swallowing: A few with stimulation Intervention(s): Hand expression Intervention(s): Hand expression;Alternate breast massage  Type of Nipple: Everted at rest and after stimulation  Comfort (Breast/Nipple): Filling, red/small blisters or bruises, mild/mod discomfort  Problem noted: Mild/Moderate discomfort Interventions (Mild/moderate discomfort): Hand expression  Hold (Positioning): Assistance needed to correctly position infant at breast and maintain latch. Intervention(s): Support Pillows;Breastfeeding basics reviewed  LATCH Score: 7  Lactation Tools Discussed/Used     Consult Status      [employer]taw, Arvella Merles 02/11/2013, 9:02 PM

## 2013-02-11 NOTE — Op Note (Signed)
NAMEJULIANNE, Ruth Gross              ACCOUNT NO.:  [id number]  MEDICAL RECORD NO.:  [id number]  LOCATION:  9147                          FACILITY:  [facility]  PHYSICIAN:  Malva Limes, M.D.    DATE OF BIRTH:  1995/08/08  DATE OF PROCEDURE:  02/10/2013 DATE OF DISCHARGE:                              OPERATIVE REPORT   PREOPERATIVE DIAGNOSES: 1. Intrauterine pregnancy at term. 2. Premature rupture of membranes. 3. Failure to progress.  POSTOPERATIVE DIAGNOSES: 1. Intrauterine pregnancy at term. 2. Premature rupture of membranes. 3. Failure to progress.  PROCEDURE:  Primary low transverse cesarean section.  SURGEON:  Malva Limes, MD  ANESTHESIA:  Epidural and local.  ANTIBIOTICS:  Ancef 2 g.  DRAINS:  Foley bedside drainage.  ESTIMATED BLOOD LOSS:  100 mL.  SPECIMENS:  None.  COMPLICATIONS:  None.  FINDINGS:  The patient had normal fallopian tubes and ovaries bilaterally.  Placenta appeared to be normal.  DESCRIPTION OF PROCEDURE:  The patient was taken to the operating room, where her epidural anesthetic was reinjected.  Once an adequate level was reached, the patient was prepped and draped in usual fashion for this procedure.  A Pfannenstiel incision was made 2 cm above the pubic symphysis on entering the abdominal cavity.  The bladder flap was taken down with sharp dissection.  A low-transverse uterine incision was made in the midline and extended laterally with blunt dissection.  Amniotic fluid noted be clear.  The infant was delivered in a vertex presentation.  On delivery of the head, the oropharynx and nostrils were bulb suctioned.  The remaining infant was then delivered.  The cord doubly clamped and cut and the infant handed to the awaiting NICU team. Cord blood was then obtained.  The placenta was manually removed.  The uterus was examined and wiped with a wet lap.  The uterine incision closed in a single layer of 0 Monocryl suture in a running,  locking fashion.  The bladder flap was closed using 2-0 Monocryl in a running fashion.  The parietal peritoneum and rectus muscles were approximated in the midline using 2-0 Monocryl suture in a running fashion.  The fascia was closed using 0 Monocryl suture in a running fashion.  Subcuticular tissue was made hemostatic with the Bovie.  A 2-0 plain gut suture was used to close the subcuticular tissue in interrupted fashion.  Stainless steel clips were used to close the skin.  The patient tolerated the procedure well.  She was taken to recovery room in stable condition.  Instrument and lap count was correct x3.          ______________________________ Malva Limes, M.D.     MA/MEDQ  D:  02/10/2013  T:  02/11/2013  Job:  [id number]

## 2013-02-11 NOTE — Transfer of Care (Signed)
Immediate Anesthesia Transfer of Care Note  Patient: Ruth Gross  Procedure(s) Performed: Procedure(s): CESAREAN SECTION (N/A)  Patient Location: PACU  Anesthesia Type:Epidural  Level of Consciousness: awake, alert  and oriented  Airway & Oxygen Therapy: Patient Spontanous Breathing  Post-op Assessment: Report given to PACU RN  Post vital signs: Reviewed  Complications: No apparent anesthesia complications

## 2013-02-11 NOTE — Progress Notes (Signed)
POD#1 Pt without complaints VSSAF IMP/ stable Plan/ routine care.

## 2013-02-11 NOTE — Anesthesia Postprocedure Evaluation (Signed)
Anesthesia Post Note  Patient: Ruth Gross  Procedure(s) Performed: Procedure(s): CESAREAN SECTION (N/A)  Anesthesia type: Epidural  Patient location: Mother/Baby  Post pain: Pain level controlled  Post assessment: Post-op Vital signs reviewed  Last Vitals: BP 117/58  Pulse 72  Temp(Src) 36.9 C (Oral)  Resp 20  Ht 5\' 4"  (1.626 m)  Wt 189 lb (85.73 kg)  BMI 32.43 kg/m2  SpO2 95%  LMP 05/10/2012  Post vital signs: Reviewed  Level of consciousness: awake  Complications: No apparent anesthesia complications

## 2013-02-12 NOTE — Progress Notes (Signed)
POD#2 Pt without complaints. VSSAF IMP/ stable Plan/ routine care.

## 2013-02-12 NOTE — Lactation Note (Signed)
This note was copied from the chart of Ruth Gross. Lactation Consultation Note Follow up visit at 42 hours of age.  Mom has been giving formula in bottles and fewer breast feedings. Discussed at length supply and demand and importance of frequent feeding for build up of milk supply.  Discussed future pumping.  Mom has pink right nipple and mild bruising and redness on left nipple. Comfort gels are being used.  Mom reports continuing to have pain with latch, but does not seem to be the only reason for bottle feeding. When asked for a plan she looked to grandma to tell her what to do.  Offered support and encouraged to call for assist as needed.     Patient Name: Ruth Gross [id number] Date: 02/12/2013 Reason for consult: Follow-up assessment   Maternal Data    Feeding Feeding Type: Bottle Fed - Formula Length of feed: 5 min  LATCH Score/Interventions Latch: Grasps breast easily, tongue down, lips flanged, rhythmical sucking.  Audible Swallowing: A few with stimulation Intervention(s): Hand expression  Type of Nipple: Everted at rest and after stimulation  Comfort (Breast/Nipple): Filling, red/small blisters or bruises, mild/mod discomfort  Problem noted: Mild/Moderate discomfort Interventions (Mild/moderate discomfort): Hand expression  Hold (Positioning): Assistance needed to correctly position infant at breast and maintain latch. Intervention(s): Support Pillows  LATCH Score: 7  Lactation Tools Discussed/Used     Consult Status Date: 02/13/13 Follow-up type: In-patient    Jannifer Rodney 02/12/2013, 6:07 PM

## 2013-02-13 ENCOUNTER — Encounter (HOSPITAL_COMMUNITY): Payer: Self-pay | Admitting: Obstetrics and Gynecology

## 2013-02-13 MED ORDER — OXYCODONE-ACETAMINOPHEN 5-325 MG PO TABS: 1.0000 | ORAL_TABLET | ORAL | Status: DC | PRN

## 2013-02-13 NOTE — Discharge Summary (Signed)
Obstetric Discharge Summary Reason for Admission: rupture of membranes Prenatal Procedures: ultrasound Intrapartum Procedures: cesarean: low cervical, transverse Postpartum Procedures: none Complications-Operative and Postpartum: none Hemoglobin  Date Value Range Status  02/11/2013 10.8* 12.0 - 15.0 g/dL Final     REPEATED TO VERIFY     DELTA CHECK NOTED     HCT  Date Value Range Status  02/11/2013 31.7* 36.0 - 46.0 % Final    Physical Exam:  General: alert and cooperative Lochia: appropriate Uterine Fundus: firm Incision: healing well, no significant drainage DVT Evaluation: No evidence of DVT seen on physical exam.  Discharge Diagnoses: Term Pregnancy-delivered  Discharge Information: Date: 02/13/2013 Activity: pelvic rest Diet: routine Medications: PNV, Ibuprofen and Percocet Condition: stable Instructions: refer to practice specific booklet Discharge to: home Follow-up Information   Follow up with Ruth Aland, MD In 2 weeks.   Specialty:  Obstetrics and Gynecology   Contact information:   [address] [place] [address] [phone]       Newborn Data: Live born female  Birth Weight: 7 lb 5.8 oz (3340 g) APGAR: 8, 9  Home with mother.  Ruth Gross 02/13/2013, 10:20 AM

## 2013-06-21 DIAGNOSIS — Z862 Personal history of diseases of the blood and blood-forming organs and certain disorders involving the immune mechanism: Secondary | ICD-10-CM | POA: Insufficient documentation

## 2013-06-21 DIAGNOSIS — R42 Dizziness and giddiness: Secondary | ICD-10-CM | POA: Insufficient documentation

## 2013-06-21 DIAGNOSIS — K625 Hemorrhage of anus and rectum: Secondary | ICD-10-CM | POA: Insufficient documentation

## 2013-06-21 LAB — CBC
HCT: 41.3 % (ref 36.0–46.0)
Hemoglobin: 14.1 g/dL (ref 12.0–15.0)
MCH: 28.4 pg (ref 26.0–34.0)
MCHC: 34.1 g/dL (ref 30.0–36.0)
MCV: 83.3 fL (ref 78.0–100.0)
Platelets: 290 10*3/uL (ref 150–400)
RBC: 4.96 MIL/uL (ref 3.87–5.11)
RDW: 12.8 % (ref 11.5–15.5)
WBC: 10 10*3/uL (ref 4.0–10.5)

## 2013-06-21 LAB — COMPREHENSIVE METABOLIC PANEL
ALBUMIN: 4.3 g/dL (ref 3.5–5.2)
ALT: 15 U/L (ref 0–35)
AST: 17 U/L (ref 0–37)
Alkaline Phosphatase: 103 U/L (ref 39–117)
BUN: 13 mg/dL (ref 6–23)
CALCIUM: 9.6 mg/dL (ref 8.4–10.5)
CHLORIDE: 101 meq/L (ref 96–112)
CO2: 25 mEq/L (ref 19–32)
CREATININE: 0.63 mg/dL (ref 0.50–1.10)
GFR calc Af Amer: >90 mL/min (ref >90)
GFR calc non Af Amer: >90 mL/min (ref >90)
Glucose, Bld: 87 mg/dL (ref 70–99)
Potassium: 4.1 mEq/L (ref 3.7–5.3)
Sodium: 142 mEq/L (ref 137–147)
Total Bilirubin: 0.3 mg/dL (ref 0.3–1.2)
Total Protein: 7.9 g/dL (ref 6.0–8.3)

## 2013-06-21 LAB — TYPE AND SCREEN
ABO/RH(D): O POS
ANTIBODY SCREEN: NEGATIVE

## 2013-06-21 LAB — ABO/RH: ABO/RH(D): O POS

## 2013-06-21 NOTE — ED Notes (Signed)
Pt reports bright red rectal bleeding for the past few months. Pt reports bleeding with BM, and blood on under ware, reports weakness, hx of anemia, reports of dizziness, shortness of breath, and fatigue. Pt is A&Ox4, respirations equal and unlabored, skin warm and dry.

## 2013-06-22 ENCOUNTER — Emergency Department (HOSPITAL_COMMUNITY)
Admission: EM | Admit: 2013-06-22 | Discharge: 2013-06-22 | Disposition: A | Discharge status: Home / Self Care | Payer: Medicaid Other | Attending: Emergency Medicine | Admitting: Emergency Medicine

## 2013-06-22 DIAGNOSIS — K625 Hemorrhage of anus and rectum: Secondary | ICD-10-CM

## 2013-06-22 LAB — POC OCCULT BLOOD, ED: Fecal Occult Bld: NEGATIVE

## 2013-06-22 NOTE — Discharge Instructions (Signed)

## 2013-06-22 NOTE — ED Provider Notes (Signed)
CSN: [id number]     Arrival date & time 06/21/13  1950 History   First MD Initiated Contact with Patient 06/22/13 0257     Chief Complaint  Patient presents with  . Rectal Bleeding     (Consider location/radiation/quality/duration/timing/severity/associated sxs/prior Treatment) HPI History per patient. Bright red blood per rectum with bowel movements ongoing for the last few months. Patient has a history of anemia, has never required blood transfusion. She's never been evaluated for rectal bleeding. Over last few days has been feeling dizzy and fatigued and her mother encouraged her to be evaluated tonight. No rectal pain. No abdominal pain. No syncope. Patient very anxious about her history of anemia. She has never seen a GI specialist. Symptoms mild in severity.  Past Medical History  Diagnosis Date  . No pertinent past medical history   . Anemia    Past Surgical History  Procedure Laterality Date  . No past surgeries    . Dilation and evacuation  04/10/2012    Procedure: DILATATION AND EVACUATION;  Surgeon: Mickel Baasichard D Kaplan, MD;  Location: [facility];  Service: Gynecology;  Laterality: N/A;  . Dilation and evacuation N/A 06/29/2012    Procedure: DILATATION AND EVACUATION;  Surgeon: Mickel Baasichard D Kaplan, MD;  Location: [facility];  Service: Gynecology;  Laterality: N/A;  . Cesarean section N/A 02/10/2013    Procedure: CESAREAN SECTION;  Surgeon: Levi AlandMark E Anderson, MD;  Location: [facility];  Service: Obstetrics;  Laterality: N/A;   Family History  Problem Relation Age of Onset  . Other Neg Hx   . Diabetes Father   . Diabetes Sister    History  Substance Use Topics  . Smoking status: Never Smoker   . Smokeless tobacco: Not on file  . Alcohol Use: No   OB History   Grav Para Term Preterm Abortions TAB SAB Ect Mult Living   2 1 1  1  1   1      Review of Systems  Constitutional: Negative for fever and chills.  Respiratory: Negative for shortness of breath.   Cardiovascular: Negative for  chest pain.  Gastrointestinal: Positive for blood in stool. Negative for vomiting, abdominal pain and constipation.  Genitourinary: Negative for flank pain and vaginal bleeding.  Musculoskeletal: Negative for back pain, neck pain and neck stiffness.  Skin: Negative for rash.  Neurological: Positive for dizziness. Negative for weakness.  All other systems reviewed and are negative.     Allergies  Review of patient's allergies indicates no known allergies.  Home Medications  No current outpatient prescriptions on file. BP 110/64  Pulse 82  Temp(Src) 98.9 F (37.2 C) (Oral)  Resp 18  Ht 5\' 4"  (1.626 m)  Wt 182 lb 1.6 oz (82.6 kg)  BMI 31.24 kg/m2  SpO2 100% Physical Exam  Constitutional: She is oriented to person, place, and time. She appears well-developed and well-nourished.  HENT:  Head: Normocephalic and atraumatic.  Eyes: EOM are normal. Pupils are equal, round, and reactive to light.  Neck: Neck supple.  Cardiovascular: Normal rate, regular rhythm and intact distal pulses.   Pulmonary/Chest: Effort normal and breath sounds normal. No respiratory distress.  Abdominal: Soft. Bowel sounds are normal. She exhibits no distension. There is no tenderness. There is no rebound and no guarding.  Genitourinary:  Rectal exam: No stool in rectal vault. No fissures. No external hemorrhoids.  Musculoskeletal: Normal range of motion. She exhibits no edema.  Neurological: She is alert and oriented to person, place, and time. No cranial nerve  deficit.  Skin: Skin is warm and dry. No pallor.    ED Course  Procedures (including critical care time) Labs Review Results for orders placed during the hospital encounter of 06/22/13  CBC      Result Value Ref Range   WBC 10.0  4.0 - 10.5 K/uL   RBC 4.96  3.87 - 5.11 MIL/uL   Hemoglobin 14.1  12.0 - 15.0 g/dL   HCT 16.1  09.6 - 04.5 %   MCV 83.3  78.0 - 100.0 fL   MCH 28.4  26.0 - 34.0 pg   MCHC 34.1  30.0 - 36.0 g/dL   RDW 40.9  81.1 -  91.4 %   Platelets 290  150 - 400 K/uL  COMPREHENSIVE METABOLIC PANEL      Result Value Ref Range   Sodium 142  137 - 147 mEq/L   Potassium 4.1  3.7 - 5.3 mEq/L   Chloride 101  96 - 112 mEq/L   CO2 25  19 - 32 mEq/L   Glucose, Bld 87  70 - 99 mg/dL   BUN 13  6 - 23 mg/dL   Creatinine, Ser 7.82  0.50 - 1.10 mg/dL   Calcium 9.6  8.4 - 95.6 mg/dL   Total Protein 7.9  6.0 - 8.3 g/dL   Albumin 4.3  3.5 - 5.2 g/dL   AST 17  0 - 37 U/L   ALT 15  0 - 35 U/L   Alkaline Phosphatase 103  39 - 117 U/L   Total Bilirubin 0.3  0.3 - 1.2 mg/dL   GFR calc non Af Amer >90  >90 mL/min   GFR calc Af Amer >90  >90 mL/min  POC OCCULT BLOOD, ED      Result Value Ref Range   Fecal Occult Bld NEGATIVE  NEGATIVE  TYPE AND SCREEN      Result Value Ref Range   ABO/RH(D) O POS     Antibody Screen NEG     Sample Expiration 06/24/2013    ABO/RH      Result Value Ref Range   ABO/RH(D) O POS     Triage labs obtained and reviewed as above. At time of my evaluation patient is no longer tachycardic with heart regular rate and rhythm.  Plan discharge home with referral to GI. Patient agrees to call today to schedule close followup. Strict return precautions verbalized is understood.  MDM   Diagnosis: Bright red blood per rectum  Symptoms ongoing for months. No obvious source of bleeding the external exam. Labs reviewed as above hemoglobin 14. Blood pressure in normal range. In the emergency department heart rate normal - noted to be tachycardic in triage but this has normalized without treatment.  Presentation does not suggest indication for admission at this time. Patient stable and appropriate for outpatient management. Vital signs and nursing notes reviewed and considered    Sunnie Nielsen, MD 06/22/13 [phone]

## 2013-06-22 NOTE — ED Notes (Signed)
Dr. Dierdre Highmanpitz at the bedside with RN assist.

## 2013-06-27 ENCOUNTER — Ambulatory Visit: Payer: Medicaid Other | Admitting: Pediatrics

## 2013-11-08 IMAGING — US US OB TRANSVAGINAL
1 series · 14 of 28 positions shown · non-contrast
Comparison: 06/29/2012.

CLINICAL DATA: Vaginal bleeding.

OBSTETRIC <14 WK US AND TRANSVAGINAL OB US
TECHNIQUE: Both transabdominal and transvaginal ultrasound
examinations were performed for complete evaluation of the
gestation as well as the maternal uterus, adnexal regions, and
pelvic cul-de-sac.  Transvaginal technique was performed to assess
early pregnancy.

[Series 1: us ob transvaginal · 36 acquisitions, 14 frames shown]
[im 2/36]
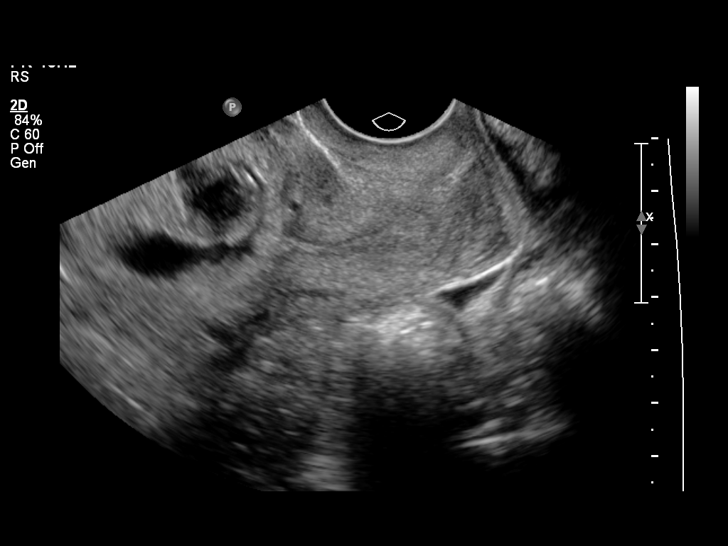
[im 4/36]
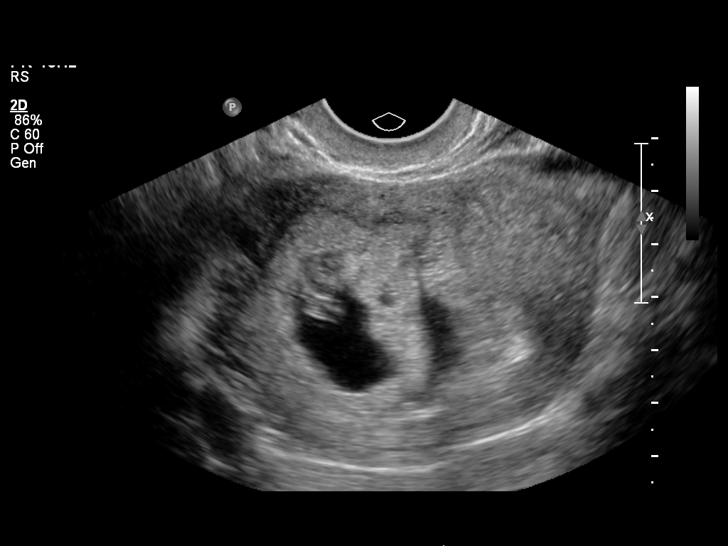
[im 7/36]
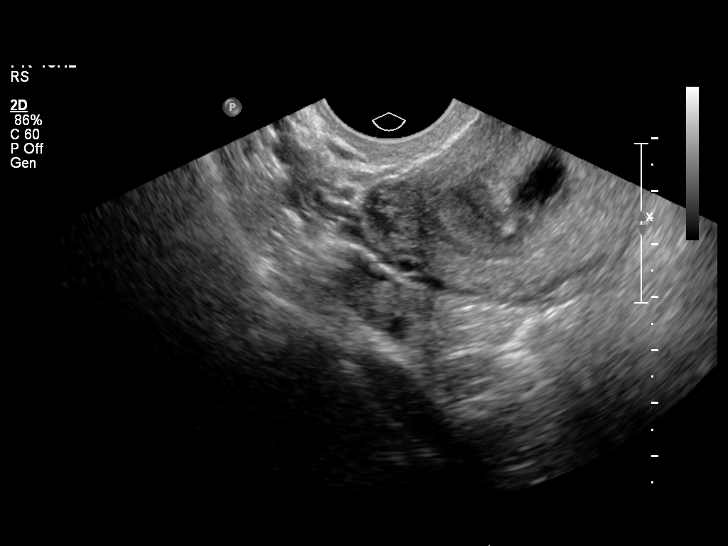
[im 10/36]
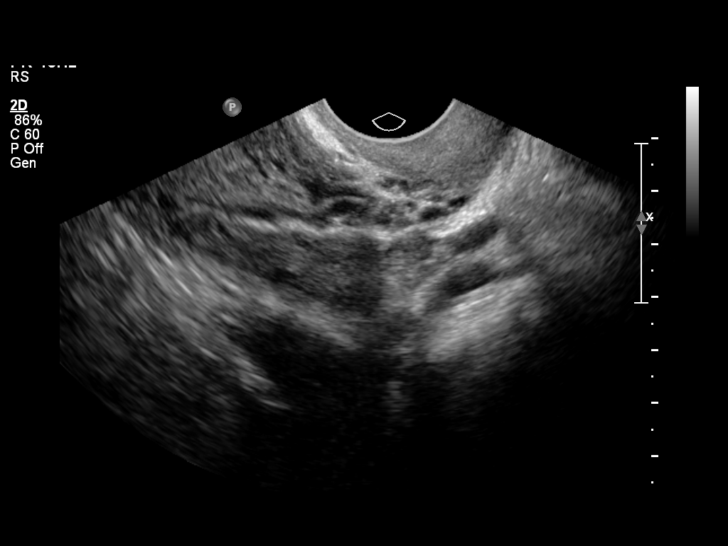
[im 12/36]
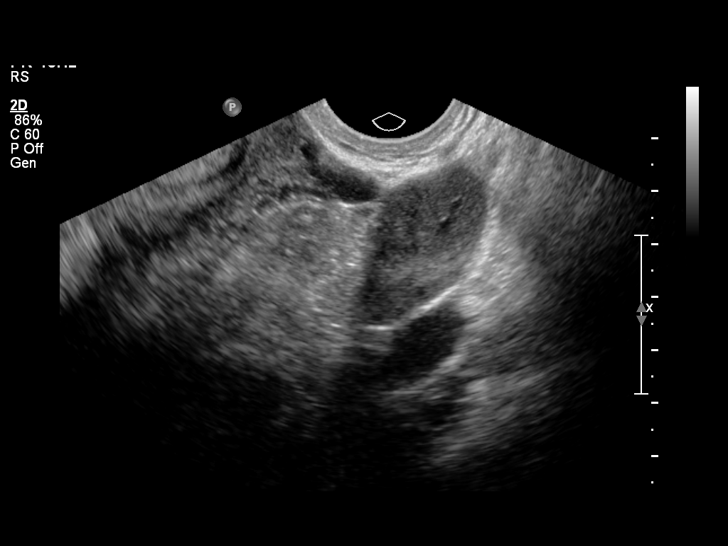
[im 15/36]
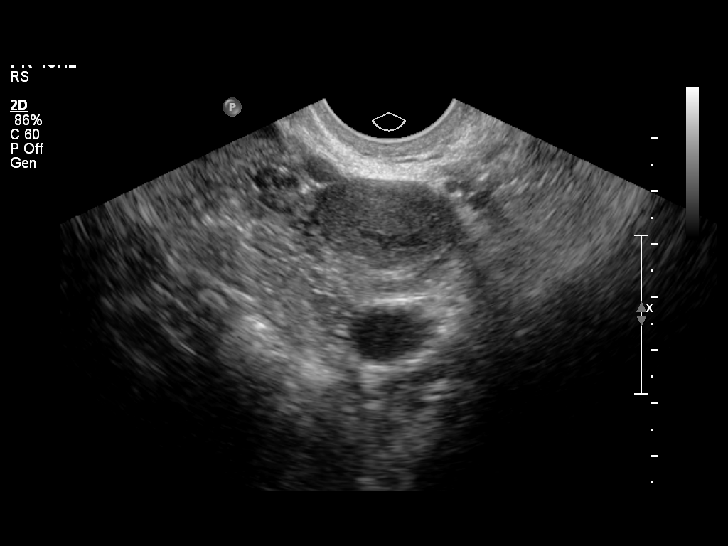
[im 17/36]
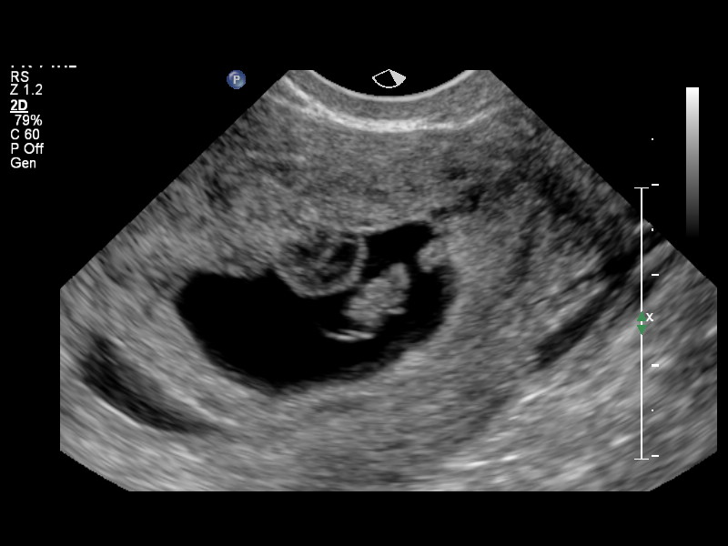
[im 20/36]
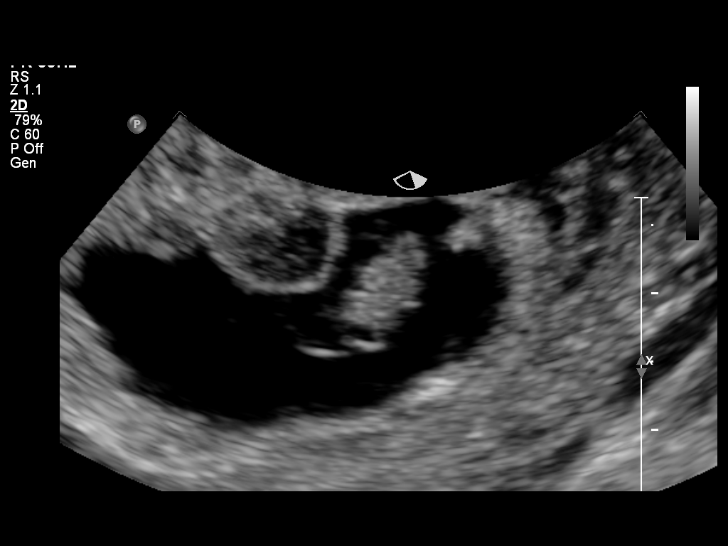
[im 23/36]
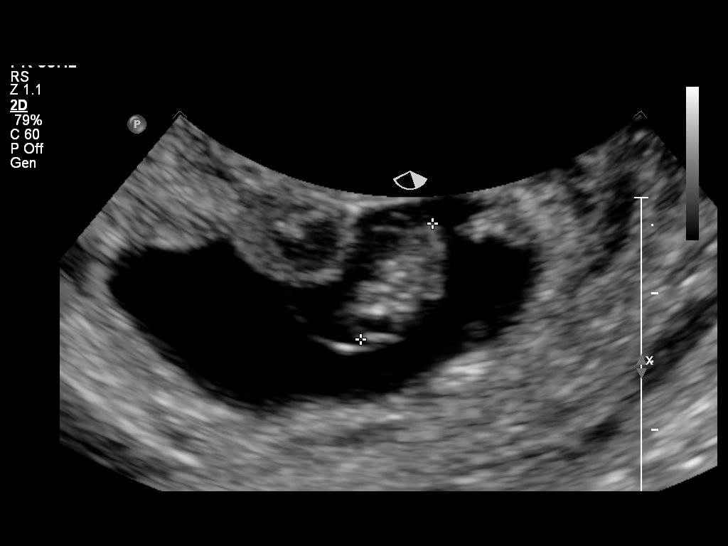
[im 25/36]
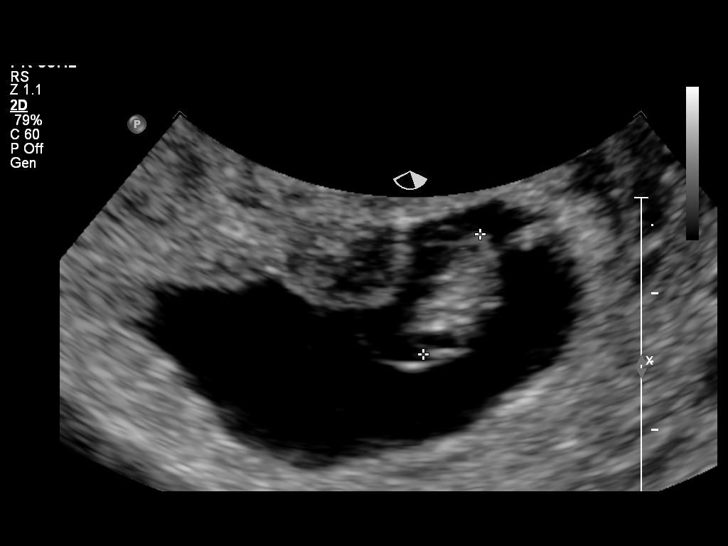
[im 28/36]
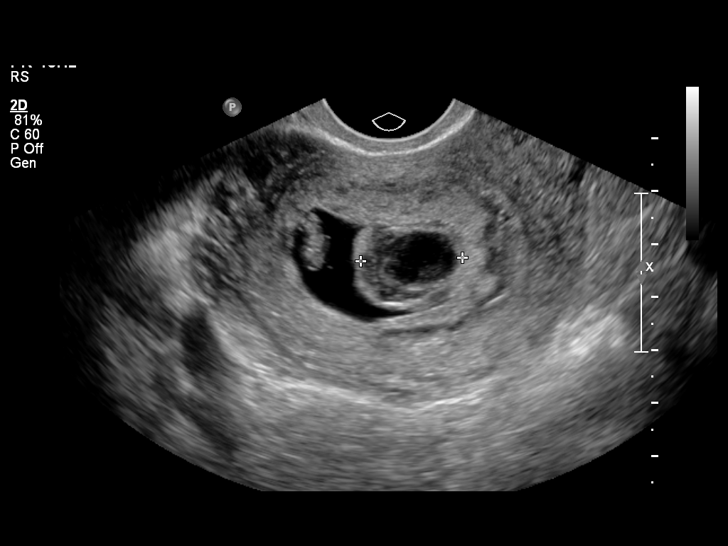
[im 30/36]
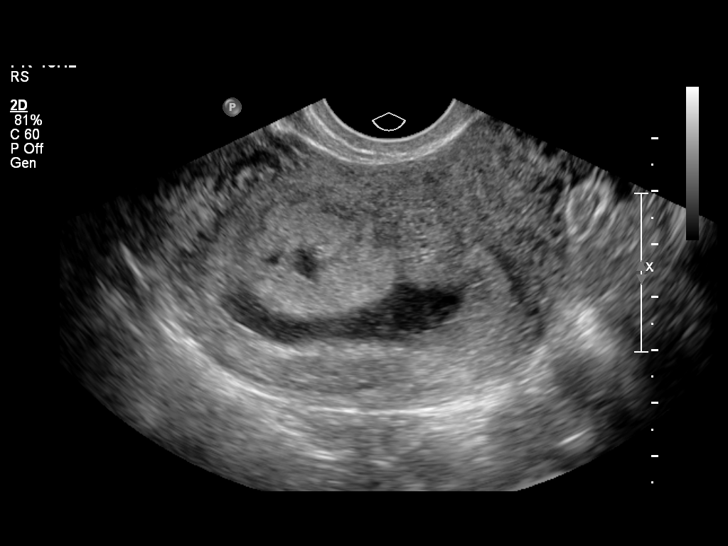
[im 33/36]
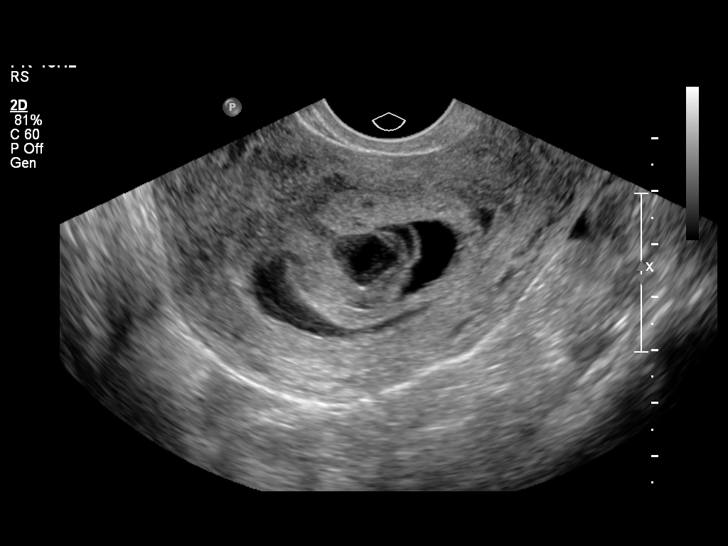
[im 36/36]
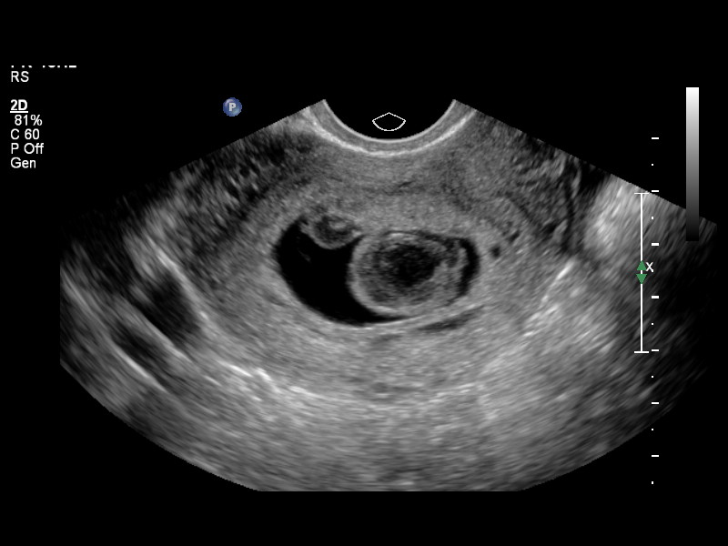

[14 of 28 positions shown; findings below may reference images not displayed]

Intrauterine gestational sac:  Visualized/normal in shape.
Yolk sac: Present
Embryo: Present
Cardiac Activity: Present
Heart Rate: 146 bpm

MSD:  mm   w  d
CRL: 9.9  mm  7 w  1 d        US EDC: 02/22/2013

Maternal uterus/adnexae:
Moderate subchorionic hemorrhages.  The larger hemorrhage measures
4.6 x 2.1 x 0.7 cm and the smaller hemorrhage measures 1.8 x 2.0 x
1.9 cm.

Normal ovaries.  Corpus luteum cyst noted on the left.

Trace free pelvic fluid.
IMPRESSION: 1.  Single living intrauterine embryo estimated at 7-week and 1-day
gestation.
2.  Two moderate sized subchorionic hemorrhages.
3.  Normal ovaries.

## 2014-01-15 ENCOUNTER — Encounter (HOSPITAL_COMMUNITY): Payer: Self-pay | Admitting: Obstetrics and Gynecology

## 2014-11-21 ENCOUNTER — Encounter (HOSPITAL_COMMUNITY): Payer: Self-pay | Admitting: *Deleted

## 2014-11-21 ENCOUNTER — Inpatient Hospital Stay (HOSPITAL_COMMUNITY)
Admission: AD | Admit: 2014-11-21 | Discharge: 2014-11-21 | Disposition: A | Discharge status: Home / Self Care | Payer: Self-pay | Source: Ambulatory Visit | Attending: Obstetrics and Gynecology | Admitting: Obstetrics and Gynecology

## 2014-11-21 DIAGNOSIS — T8389XA Other specified complication of genitourinary prosthetic devices, implants and grafts, initial encounter: Secondary | ICD-10-CM

## 2014-11-21 DIAGNOSIS — N939 Abnormal uterine and vaginal bleeding, unspecified: Secondary | ICD-10-CM | POA: Insufficient documentation

## 2014-11-21 DIAGNOSIS — Z30432 Encounter for removal of intrauterine contraceptive device: Secondary | ICD-10-CM | POA: Insufficient documentation

## 2014-11-21 DIAGNOSIS — R102 Pelvic and perineal pain: Secondary | ICD-10-CM

## 2014-11-21 DIAGNOSIS — R109 Unspecified abdominal pain: Secondary | ICD-10-CM | POA: Insufficient documentation

## 2014-11-21 DIAGNOSIS — N898 Other specified noninflammatory disorders of vagina: Secondary | ICD-10-CM | POA: Insufficient documentation

## 2014-11-21 LAB — CBC WITH DIFFERENTIAL/PLATELET
BASOS ABS: 0 10*3/uL (ref 0.0–0.1)
Basophils Relative: 0 % (ref 0–1)
Eosinophils Absolute: 0.1 10*3/uL (ref 0.0–0.7)
Eosinophils Relative: 1 % (ref 0–5)
HEMATOCRIT: 41.2 % (ref 36.0–46.0)
HEMOGLOBIN: 13.7 g/dL (ref 12.0–15.0)
LYMPHS PCT: 30 % (ref 12–46)
Lymphs Abs: 3.7 10*3/uL (ref 0.7–4.0)
MCH: 27.9 pg (ref 26.0–34.0)
MCHC: 33.3 g/dL (ref 30.0–36.0)
MCV: 83.9 fL (ref 78.0–100.0)
Monocytes Absolute: 0.8 10*3/uL (ref 0.1–1.0)
Monocytes Relative: 6 % (ref 3–12)
NEUTROS ABS: 7.6 10*3/uL (ref 1.7–7.7)
NEUTROS PCT: 63 % (ref 43–77)
PLATELETS: 300 10*3/uL (ref 150–400)
RBC: 4.91 MIL/uL (ref 3.87–5.11)
RDW: 13.7 % (ref 11.5–15.5)
WBC: 12.2 10*3/uL — AB (ref 4.0–10.5)

## 2014-11-21 LAB — WET PREP, GENITAL
Clue Cells Wet Prep HPF POC: NONE SEEN
TRICH WET PREP: NONE SEEN
YEAST WET PREP: NONE SEEN

## 2014-11-21 LAB — URINE MICROSCOPIC-ADD ON

## 2014-11-21 LAB — URINALYSIS, ROUTINE W REFLEX MICROSCOPIC
Bilirubin Urine: NEGATIVE
Glucose, UA: NEGATIVE mg/dL
Ketones, ur: NEGATIVE mg/dL
Leukocytes, UA: NEGATIVE
NITRITE: NEGATIVE
Protein, ur: NEGATIVE mg/dL
Urobilinogen, UA: 0.2 mg/dL (ref 0.0–1.0)
pH: 5.5 (ref 5.0–8.0)

## 2014-11-21 LAB — POCT PREGNANCY, URINE: PREG TEST UR: NEGATIVE

## 2014-11-21 NOTE — Discharge Instructions (Signed)
Contraception Choices Contraception (birth control) is the use of any methods or devices to prevent pregnancy. Below are some methods to help avoid pregnancy. HORMONAL METHODS   Contraceptive implant. This is a thin, plastic tube containing progesterone hormone. It does not contain estrogen hormone. Your health care provider inserts the tube in the inner part of the upper arm. The tube can remain in place for up to 3 years. After 3 years, the implant must be removed. The implant prevents the ovaries from releasing an egg (ovulation), thickens the cervical mucus to prevent sperm from entering the uterus, and thins the lining of the inside of the uterus.  Progesterone-only injections. These injections are given every 3 months by your health care provider to prevent pregnancy. This synthetic progesterone hormone stops the ovaries from releasing eggs. It also thickens cervical mucus and changes the uterine lining. This makes it harder for sperm to survive in the uterus.  Birth control pills. These pills contain estrogen and progesterone hormone. They work by preventing the ovaries from releasing eggs (ovulation). They also cause the cervical mucus to thicken, preventing the sperm from entering the uterus. Birth control pills are prescribed by a health care provider.Birth control pills can also be used to treat heavy periods.  Minipill. This type of birth control pill contains only the progesterone hormone. They are taken every day of each month and must be prescribed by your health care provider.  Birth control patch. The patch contains hormones similar to those in birth control pills. It must be changed once a week and is prescribed by a health care provider.  Vaginal ring. The ring contains hormones similar to those in birth control pills. It is left in the vagina for 3 weeks, removed for 1 week, and then a new one is put back in place. The patient must be comfortable inserting and removing the ring  from the vagina.A health care provider's prescription is necessary.  Emergency contraception. Emergency contraceptives prevent pregnancy after unprotected sexual intercourse. This pill can be taken right after sex or up to 5 days after unprotected sex. It is most effective the sooner you take the pills after having sexual intercourse. Most emergency contraceptive pills are available without a prescription. Check with your pharmacist. Do not use emergency contraception as your only form of birth control. BARRIER METHODS   Female condom. This is a thin sheath (latex or rubber) that is worn over the penis during sexual intercourse. It can be used with spermicide to increase effectiveness.  Female condom. This is a soft, loose-fitting sheath that is put into the vagina before sexual intercourse.  Diaphragm. This is a soft, latex, dome-shaped barrier that must be fitted by a health care provider. It is inserted into the vagina, along with a spermicidal jelly. It is inserted before intercourse. The diaphragm should be left in the vagina for 6 to 8 hours after intercourse.  Cervical cap. This is a round, soft, latex or plastic cup that fits over the cervix and must be fitted by a health care provider. The cap can be left in place for up to 48 hours after intercourse.  Sponge. This is a soft, circular piece of polyurethane foam. The sponge has spermicide in it. It is inserted into the vagina after wetting it and before sexual intercourse.  Spermicides. These are chemicals that kill or block sperm from entering the cervix and uterus. They come in the form of creams, jellies, suppositories, foam, or tablets. They do not require a   prescription. They are inserted into the vagina with an applicator before having sexual intercourse. The process must be repeated every time you have sexual intercourse. INTRAUTERINE CONTRACEPTION  Intrauterine device (IUD). This is a T-shaped device that is put in a woman's uterus  during a menstrual period to prevent pregnancy. There are 2 types:  Copper IUD. This type of IUD is wrapped in copper wire and is placed inside the uterus. Copper makes the uterus and fallopian tubes produce a fluid that kills sperm. It can stay in place for 10 years.  Hormone IUD. This type of IUD contains the hormone progestin (synthetic progesterone). The hormone thickens the cervical mucus and prevents sperm from entering the uterus, and it also thins the uterine lining to prevent implantation of a fertilized egg. The hormone can weaken or kill the sperm that get into the uterus. It can stay in place for 3-5 years, depending on which type of IUD is used. PERMANENT METHODS OF CONTRACEPTION  Female tubal ligation. This is when the woman's fallopian tubes are surgically sealed, tied, or blocked to prevent the egg from traveling to the uterus.  Hysteroscopic sterilization. This involves placing a small coil or insert into each fallopian tube. Your doctor uses a technique called hysteroscopy to do the procedure. The device causes scar tissue to form. This results in permanent blockage of the fallopian tubes, so the sperm cannot fertilize the egg. It takes about 3 months after the procedure for the tubes to become blocked. You must use another form of birth control for these 3 months.  Female sterilization. This is when the female has the tubes that carry sperm tied off (vasectomy).This blocks sperm from entering the vagina during sexual intercourse. After the procedure, the man can still ejaculate fluid (semen). NATURAL PLANNING METHODS  Natural family planning. This is not having sexual intercourse or using a barrier method (condom, diaphragm, cervical cap) on days the woman could become pregnant.  Calendar method. This is keeping track of the length of each menstrual cycle and identifying when you are fertile.  Ovulation method. This is avoiding sexual intercourse during ovulation.  Symptothermal  method. This is avoiding sexual intercourse during ovulation, using a thermometer and ovulation symptoms.  Post-ovulation method. This is timing sexual intercourse after you have ovulated. Regardless of which type or method of contraception you choose, it is important that you use condoms to protect against the transmission of sexually transmitted infections (STIs). Talk with your health care provider about which form of contraception is most appropriate for you. Document Released: 03/02/2005 Document Revised: 03/07/2013 Document Reviewed: 08/25/2012 [employer] Patient Information 2015 [employer]. This information is not intended to replace advice given to you by your health care provider. Make sure you discuss any questions you have with your health care provider.  

## 2014-11-21 NOTE — MAU Note (Signed)
Written consent obtained. IUD removed by Vonzella Nipple PA

## 2014-11-21 NOTE — MAU Note (Signed)
Pt reports really bad cramping and spotting x 2 weeks. Also reports back pain for 2 weeks. Has had an IUD for 1 1/2 years but cannot feel the strings now.

## 2014-11-21 NOTE — MAU Provider Note (Signed)
History     CSN: [id number]  Arrival date and time: 11/21/14 1847   First Provider Initiated Contact with Patient 11/21/14 2105      No chief complaint on file.  HPI [name] is a 19 y.o. G2P1011 who presents to MAU today with complaint of lower abdominal cramping and spotting. The patient also endorses a recent increased in creamy white discharge. She is sexually active and does not use condoms. She has had Mirena IUD in place since January 2015 with minimal complications until recently. She states that she has had progressively worsening lower abdominal and back pain x 2 weeks. She denies fever or UTI symptoms. She has tried Tylenol for pain with minimal relief.    OB History    Gravida Para Term Preterm AB TAB SAB Ectopic Multiple Living   2 1 1  1  1   1       Past Medical History  Diagnosis Date  . No pertinent past medical history   . Anemia     Past Surgical History  Procedure Laterality Date  . No past surgeries    . Dilation and evacuation  04/10/2012    Procedure: DILATATION AND EVACUATION;  Surgeon: Mickel Baas, MD;  Location: [facility];  Service: Gynecology;  Laterality: N/A;  . Dilation and evacuation N/A 06/29/2012    Procedure: DILATATION AND EVACUATION;  Surgeon: Mickel Baas, MD;  Location: [facility];  Service: Gynecology;  Laterality: N/A;  . Cesarean section N/A 02/10/2013    Procedure: CESAREAN SECTION;  Surgeon: Levi Aland, MD;  Location: [facility];  Service: Obstetrics;  Laterality: N/A;    Family History  Problem Relation Age of Onset  . Other Neg Hx   . Diabetes Father   . Diabetes Sister     Social History  Substance Use Topics  . Smoking status: Never Smoker   . Smokeless tobacco: None  . Alcohol Use: No    Allergies: No Known Allergies  No prescriptions prior to admission    Review of Systems  Constitutional: Negative for fever and malaise/fatigue.  Gastrointestinal: Positive for abdominal pain. Negative for nausea,  vomiting, diarrhea and constipation.  Genitourinary: Negative for dysuria, urgency and frequency.       + vaginal bleeding, discharge   Physical Exam   Blood pressure 112/65, pulse 73, temperature 98.2 F (36.8 C), temperature source Oral, resp. rate 16, height 5\' 4"  (1.626 m), weight 201 lb (91.173 kg), SpO2 100 %, unknown if currently breastfeeding.  Physical Exam  Nursing note and vitals reviewed. Constitutional: She is oriented to person, place, and time. She appears well-developed and well-nourished. No distress.  HENT:  Head: Normocephalic and atraumatic.  Cardiovascular: Normal rate.   Respiratory: Effort normal.  GI: Soft. She exhibits no distension and no mass. There is no tenderness. There is no rebound and no guarding.  Genitourinary: Uterus is not enlarged and not tender. Cervix exhibits no motion tenderness, no discharge and no friability. Right adnexum displays no mass and no tenderness. Left adnexum displays no mass and no tenderness. No bleeding in the vagina. Vaginal discharge (moderate amount of thin, white discharge noted) found.  Neurological: She is alert and oriented to person, place, and time.  Skin: Skin is warm and dry. No erythema.  Psychiatric: She has a normal mood and affect.   Results for orders placed or performed during the hospital encounter of 11/21/14 (from the past 24 hour(s))  Urinalysis, Routine w reflex microscopic (not  at [facility])     Status: Abnormal   Collection Time: 11/21/14  7:18 PM  Result Value Ref Range   Color, Urine YELLOW YELLOW   APPearance CLEAR CLEAR   Specific Gravity, Urine >1.030 (H) 1.005 - 1.030   pH 5.5 5.0 - 8.0   Glucose, UA NEGATIVE NEGATIVE mg/dL   Hgb urine dipstick TRACE (A) NEGATIVE   Bilirubin Urine NEGATIVE NEGATIVE   Ketones, ur NEGATIVE NEGATIVE mg/dL   Protein, ur NEGATIVE NEGATIVE mg/dL   Urobilinogen, UA 0.2 0.0 - 1.0 mg/dL   Nitrite NEGATIVE NEGATIVE   Leukocytes, UA NEGATIVE NEGATIVE  Urine microscopic-add  on     Status: None   Collection Time: 11/21/14  7:18 PM  Result Value Ref Range   Squamous Epithelial / LPF RARE RARE   WBC, UA 0-2 <3 WBC/hpf   RBC / HPF 0-2 <3 RBC/hpf   Bacteria, UA RARE RARE   Urine-Other MUCOUS PRESENT   Pregnancy, urine POC     Status: None   Collection Time: 11/21/14  7:32 PM  Result Value Ref Range   Preg Test, Ur NEGATIVE NEGATIVE  CBC with Differential/Platelet     Status: Abnormal   Collection Time: 11/21/14  8:43 PM  Result Value Ref Range   WBC 12.2 (H) 4.0 - 10.5 K/uL   RBC 4.91 3.87 - 5.11 MIL/uL   Hemoglobin 13.7 12.0 - 15.0 g/dL   HCT 47.8 29.5 - 62.1 %   MCV 83.9 78.0 - 100.0 fL   MCH 27.9 26.0 - 34.0 pg   MCHC 33.3 30.0 - 36.0 g/dL   RDW 30.8 65.7 - 84.6 %   Platelets 300 150 - 400 K/uL   Neutrophils Relative % 63 43 - 77 %   Neutro Abs 7.6 1.7 - 7.7 K/uL   Lymphocytes Relative 30 12 - 46 %   Lymphs Abs 3.7 0.7 - 4.0 K/uL   Monocytes Relative 6 3 - 12 %   Monocytes Absolute 0.8 0.1 - 1.0 K/uL   Eosinophils Relative 1 0 - 5 %   Eosinophils Absolute 0.1 0.0 - 0.7 K/uL   Basophils Relative 0 0 - 1 %   Basophils Absolute 0.0 0.0 - 0.1 K/uL  Wet prep, genital     Status: Abnormal   Collection Time: 11/21/14  9:10 PM  Result Value Ref Range   Yeast Wet Prep HPF POC NONE SEEN NONE SEEN   Trich, Wet Prep NONE SEEN NONE SEEN   Clue Cells Wet Prep HPF POC NONE SEEN NONE SEEN   WBC, Wet Prep HPF POC FEW (A) NONE SEEN    MAU Course  Procedures GYNECOLOGY CLINIC PROCEDURE NOTE  IUD Removal  Consent signed. Time out complete. Patient was in the dorsal lithotomy position, normal external genitalia was noted.  A speculum was placed in the patient's vagina, normal discharge was noted, no lesions. The multiparous cervix was visualized, no lesions, no abnormal discharge.  The strings of the IUD were grasped and pulled using ring forceps. The IUD was removed in its entirety. Patient tolerated the procedure well.    Patient will use condoms for  contraception. Routine preventative health maintenance measures emphasized.  MDM UPT - negative UA, CBC, wet prep and GC/Chlamydia today Patient desires removal of IUD. IUD removed without complications. Patient counseled on immediate return to fertility.   Assessment and Plan  A: Lower abdominal cramping Spotting Vaginal discharge IUD removal  P: Discharge home Ibuprofen PRN for pain advised Bleeding precautions discussed Patient  advised to follow-up with GYN provider of choice to initiate new birth control Condoms advised  Patient may return to MAU as needed or if her condition were to change or worsen   Marny Lowenstein, PA-C  11/22/2014, 2:36 AM

## 2014-11-22 LAB — GC/CHLAMYDIA PROBE AMP (~~LOC~~) NOT AT ARMC
Chlamydia: NEGATIVE
NEISSERIA GONORRHEA: NEGATIVE

## 2015-01-13 ENCOUNTER — Emergency Department (HOSPITAL_COMMUNITY)
Admission: EM | Admit: 2015-01-13 | Discharge: 2015-01-13 | Disposition: A | Discharge status: Home / Self Care | Payer: Medicaid Other | Attending: Emergency Medicine | Admitting: Emergency Medicine

## 2015-01-13 ENCOUNTER — Encounter (HOSPITAL_COMMUNITY): Payer: Self-pay

## 2015-01-13 DIAGNOSIS — G44209 Tension-type headache, unspecified, not intractable: Secondary | ICD-10-CM | POA: Insufficient documentation

## 2015-01-13 DIAGNOSIS — R000 Tachycardia, unspecified: Secondary | ICD-10-CM | POA: Insufficient documentation

## 2015-01-13 DIAGNOSIS — Z862 Personal history of diseases of the blood and blood-forming organs and certain disorders involving the immune mechanism: Secondary | ICD-10-CM | POA: Insufficient documentation

## 2015-01-13 DIAGNOSIS — K625 Hemorrhage of anus and rectum: Secondary | ICD-10-CM | POA: Insufficient documentation

## 2015-01-13 LAB — URINALYSIS, ROUTINE W REFLEX MICROSCOPIC
BILIRUBIN URINE: NEGATIVE
Glucose, UA: NEGATIVE mg/dL
HGB URINE DIPSTICK: NEGATIVE
Ketones, ur: NEGATIVE mg/dL
Leukocytes, UA: NEGATIVE
NITRITE: NEGATIVE
PROTEIN: NEGATIVE mg/dL
SPECIFIC GRAVITY, URINE: 1.012 (ref 1.005–1.030)
UROBILINOGEN UA: 0.2 mg/dL (ref 0.0–1.0)
pH: 5 (ref 5.0–8.0)

## 2015-01-13 LAB — CBC
HCT: 40.5 % (ref 36.0–46.0)
HEMOGLOBIN: 13.4 g/dL (ref 12.0–15.0)
MCH: 27.6 pg (ref 26.0–34.0)
MCHC: 33.1 g/dL (ref 30.0–36.0)
MCV: 83.3 fL (ref 78.0–100.0)
Platelets: 320 10*3/uL (ref 150–400)
RBC: 4.86 MIL/uL (ref 3.87–5.11)
RDW: 13.2 % (ref 11.5–15.5)
WBC: 12 10*3/uL — ABNORMAL HIGH (ref 4.0–10.5)

## 2015-01-13 LAB — COMPREHENSIVE METABOLIC PANEL
ALBUMIN: 4.5 g/dL (ref 3.5–5.0)
ALK PHOS: 94 U/L (ref 38–126)
ALT: 21 U/L (ref 14–54)
ANION GAP: 7 (ref 5–15)
AST: 15 U/L (ref 15–41)
BUN: 10 mg/dL (ref 6–20)
CALCIUM: 9.5 mg/dL (ref 8.9–10.3)
CO2: 26 mmol/L (ref 22–32)
Chloride: 106 mmol/L (ref 101–111)
Creatinine, Ser: 0.63 mg/dL (ref 0.44–1.00)
GFR calc Af Amer: >60 mL/min (ref >60)
GFR calc non Af Amer: >60 mL/min (ref >60)
GLUCOSE: 95 mg/dL (ref 65–99)
Potassium: 3.9 mmol/L (ref 3.5–5.1)
SODIUM: 139 mmol/L (ref 135–145)
Total Bilirubin: 0.5 mg/dL (ref 0.3–1.2)
Total Protein: 7.9 g/dL (ref 6.5–8.1)

## 2015-01-13 LAB — TYPE AND SCREEN
ABO/RH(D): O POS
Antibody Screen: NEGATIVE

## 2015-01-13 LAB — POC URINE PREG, ED: PREG TEST UR: NEGATIVE

## 2015-01-13 LAB — POC OCCULT BLOOD, ED: Fecal Occult Bld: NEGATIVE

## 2015-01-13 MED ORDER — NAPROXEN 375 MG PO TABS: 375.0000 mg | ORAL_TABLET | Freq: Two times a day (BID) | ORAL | Status: DC

## 2015-01-13 MED ORDER — KETOROLAC TROMETHAMINE 30 MG/ML IJ SOLN
30.0000 mg | Freq: Once | INTRAMUSCULAR | Status: AC
  Administered 2015-01-13: 30 mg via INTRAVENOUS
  Filled 2015-01-13: qty 1

## 2015-01-13 MED ORDER — SODIUM CHLORIDE 0.9 % IV BOLUS (SEPSIS)
1000.0000 mL | Freq: Once | INTRAVENOUS | Status: AC
  Administered 2015-01-13: 1000 mL via INTRAVENOUS

## 2015-01-13 MED ORDER — ONDANSETRON HCL 4 MG/2ML IJ SOLN
4.0000 mg | Freq: Once | INTRAMUSCULAR | Status: AC
  Administered 2015-01-13: 4 mg via INTRAVENOUS
  Filled 2015-01-13: qty 2

## 2015-01-13 NOTE — ED Provider Notes (Signed)
CSN: [id number]     Arrival date & time 01/13/15  1437 History   First MD Initiated Contact with Patient 01/13/15 1728     Chief Complaint  Patient presents with  . Dizziness  . Tachycardia  . Rectal Bleeding     (Consider location/radiation/quality/duration/timing/severity/associated sxs/prior Treatment) HPI   Patient to the ER with complaints or rectal bleeding for the past 2-3 days. She says that it is dark and painless. The amount of blood is small. She is not actively passing any blood. She reports having a hx of the same and being seen here for this multiple times. Has been referred to a Gastroenterologist but has been unable to follow-up due to financial concerns.   She also reports waking up this morning with a large part of the right side of her scalp being tender to the touch. She does not remember hitting her head last night. While at work today she became dizzy as if spinning in circles and nauseous therefore she decided to come to the ER for evaluation. She has not had any slurred speech, vomiting, diarrhea, weakness, change in vision, neck pain, fevers, coughing, rash, loc, trauma, or any other associated symptoms that she can remember.  Past Medical History  Diagnosis Date  . No pertinent past medical history   . Anemia    Past Surgical History  Procedure Laterality Date  . No past surgeries    . Dilation and evacuation  04/10/2012    Procedure: DILATATION AND EVACUATION;  Surgeon: Mickel Baasichard D Kaplan, MD;  Location: [facility];  Service: Gynecology;  Laterality: N/A;  . Dilation and evacuation N/A 06/29/2012    Procedure: DILATATION AND EVACUATION;  Surgeon: Mickel Baasichard D Kaplan, MD;  Location: [facility];  Service: Gynecology;  Laterality: N/A;  . Cesarean section N/A 02/10/2013    Procedure: CESAREAN SECTION;  Surgeon: Levi AlandMark E Anderson, MD;  Location: [facility];  Service: Obstetrics;  Laterality: N/A;   Family History  Problem Relation Age of Onset  . Other Neg Hx   . Diabetes  Father   . Diabetes Sister    Social History  Substance Use Topics  . Smoking status: Never Smoker   . Smokeless tobacco: None  . Alcohol Use: Yes     Comment: occasional    OB History    Gravida Para Term Preterm AB TAB SAB Ectopic Multiple Living   2 1 1  1  1   1      Review of Systems  10 Systems reviewed and are negative for acute change except as noted in the HPI.   Allergies  Review of patient's allergies indicates no known allergies.  Home Medications   Prior to Admission medications   Medication Sig Start Date End Date Taking? Authorizing Provider  acetaminophen (TYLENOL) 325 MG tablet Take 650 mg by mouth every 6 (six) hours as needed for moderate pain or headache.    Yes Historical Provider, MD  naproxen (NAPROSYN) 375 MG tablet Take 1 tablet (375 mg total) by mouth 2 (two) times daily. 01/13/15   Jeremi Losito Neva SeatGreene, PA-C   BP 127/63 mmHg  Pulse 95  Temp(Src) 98.1 F (36.7 C) (Oral)  Resp 15  SpO2 100%  LMP 12/17/2014 Physical Exam  Constitutional: She appears well-developed and well-nourished. No distress.  HENT:  Head: Normocephalic and atraumatic.    Right Ear: Tympanic membrane and ear canal normal.  Left Ear: Tympanic membrane and ear canal normal.  No mastoid tenderness  Eyes: Pupils are equal, round,  and reactive to light.  Neck: Normal range of motion. Neck supple. No spinous process tenderness and no muscular tenderness present. No Kernig's sign noted.  Cardiovascular: Normal rate and regular rhythm.   Pulmonary/Chest: Effort normal.  Abdominal: Soft. Bowel sounds are normal. There is no tenderness. There is no rigidity, no rebound and no guarding.  Genitourinary: Rectal exam shows no external hemorrhoid, no internal hemorrhoid and no fissure. Guaiac negative stool.  Neurological: She is alert.  Cranial nerves II-VIII and X-XII evaluated and show no deficits. Pt alert and oriented x 3 Upper and lower extremity strength is symmetrical and  physiologic Normal muscular tone No facial droop Coordination intact, no limb ataxia  Skin: Skin is warm and dry.  Nursing note and vitals reviewed.   ED Course  Procedures (including critical care time) Labs Review Labs Reviewed  CBC - Abnormal; Notable for the following:    WBC 12.0 (*)    All other components within normal limits  COMPREHENSIVE METABOLIC PANEL  URINALYSIS, ROUTINE W REFLEX MICROSCOPIC (NOT AT [facility])  POC OCCULT BLOOD, ED  POC OCCULT BLOOD, ED  POC URINE PREG, ED  TYPE AND SCREEN  ABO/RH    Imaging Review No results found. I have personally reviewed and evaluated these images and lab results as part of my medical decision-making.   EKG Interpretation None      MDM   Final diagnoses:  Tension-type headache, not intractable, unspecified chronicity pattern   Patient has a negative hemoccult, stable hemoglobin and rectal exam. Referred back to GI.  Headache is atypical but it is not concerning for [facility], ICH, Meningitis, or temporal arteritis. Pt is afebrile with no focal neuro deficits, nuchal rigidity, or change in vision. The patient denies any symptoms of neurological impairment or TIA's; no amaurosis, diplopia, dysphasia, or unilateral disturbance of motor or sensory function. No loss of balance or vertigo. Patient is well appearing. Normal labs. Will dc with PCP referral and Naprosyn for pain.  Medications  sodium chloride 0.9 % bolus 1,000 mL (1,000 mLs Intravenous New Bag/Given 01/13/15 1834)  ondansetron (ZOFRAN) injection 4 mg (4 mg Intravenous Given 01/13/15 1834)  ketorolac (TORADOL) 30 MG/ML injection 30 mg (30 mg Intravenous Given 01/13/15 1834)    19 y.o.Shantia D Sunga's medical screening exam was performed and I feel the patient has had an appropriate workup for their chief complaint at this time and likelihood of emergent condition existing is low. They have been counseled on decision, discharge, follow up and which symptoms necessitate  immediate return to the emergency department. They or their family verbally stated understanding and agreement with plan and discharged in stable condition.   Vital signs are stable at discharge. Filed Vitals:   01/13/15 1900  BP: 127/63  Pulse: 95  Temp:   Resp: [id number] PA-C 01/13/15 1930  Margarita Grizzle, MD 01/13/15 [phone]

## 2015-01-13 NOTE — ED Notes (Addendum)
Patient states she has a tender spot on the right side of her head, nausea, dizziness and tingling of fingers and toes. Patient also c/o dark red rectal bleeding x 2-3 days.

## 2015-01-13 NOTE — ED Notes (Signed)
Pt reported that she continues to have headache, dizziness, tingling to bil fingers and feet/toes, nausea, but denies visual disturbances and LOC.  Pt is SR on monitor. ABC's intact. Resp even and unlabored.

## 2015-01-13 NOTE — Discharge Instructions (Signed)
Tension Headache A tension headache is a feeling of pain, pressure, or aching that is often felt over the front and sides of the head. The pain can be dull, or it can feel tight (constricting). Tension headaches are not normally associated with nausea or vomiting, and they do not get worse with physical activity. Tension headaches can last from 30 minutes to several days. This is the most common type of headache. CAUSES The exact cause of this condition is not known. Tension headaches often begin after stress, anxiety, or depression. Other triggers may include:  Alcohol.  Too much caffeine, or caffeine withdrawal.  Respiratory infections, such as colds, flu, or sinus infections.  Dental problems or teeth clenching.  Fatigue.  Holding your head and neck in the same position for a long period of time, such as while using a computer.  Smoking. SYMPTOMS Symptoms of this condition include:  A feeling of pressure around the head.  Dull, aching head pain.  Pain felt over the front and sides of the head.  Tenderness in the muscles of the head, neck, and shoulders. DIAGNOSIS This condition may be diagnosed based on your symptoms and a physical exam. Tests may be done, such as a CT scan or an MRI of your head. These tests may be done if your symptoms are severe or unusual. TREATMENT This condition may be treated with lifestyle changes and medicines to help relieve symptoms. HOME CARE INSTRUCTIONS Managing Pain  Take over-the-counter and prescription medicines only as told by your health care provider.  Lie down in a dark, quiet room when you have a headache.  If directed, apply ice to the head and neck area:  Put ice in a plastic bag.  Place a towel between your skin and the bag.  Leave the ice on for 20 minutes, 2-3 times per day.  Use a heating pad or a hot shower to apply heat to the head and neck area as told by your health care provider. Eating and Drinking  Eat meals on  a regular schedule.  Limit alcohol use.  Decrease your caffeine intake, or stop using caffeine. General Instructions  Keep all follow-up visits as told by your health care provider. This is important.  Keep a headache journal to help find out what may trigger your headaches. For example, write down:  What you eat and drink.  How much sleep you get.  Any change to your diet or medicines.  Try massage or other relaxation techniques.  Limit stress.  Sit up straight, and avoid tensing your muscles.  Do not use tobacco products, including cigarettes, chewing tobacco, or e-cigarettes. If you need help quitting, ask your health care provider.  Exercise regularly as told by your health care provider.  Get 7-9 hours of sleep, or the amount recommended by your health care provider. SEEK MEDICAL CARE IF:  Your symptoms are not helped by medicine.  You have a headache that is different from what you normally experience.  You have nausea or you vomit.  You have a fever. SEEK IMMEDIATE MEDICAL CARE IF:  Your headache becomes severe.  You have repeated vomiting.  You have a stiff neck.  You have a loss of vision.  You have problems with speech.  You have pain in your eye or ear.  You have muscular weakness or loss of muscle control.  You lose your balance or you have trouble walking.  You feel faint or you pass out.  You have confusion.     This information is not intended to replace advice given to you by your health care provider. Make sure you discuss any questions you have with your health care provider.   Document Released: 03/02/2005 Document Revised: 11/21/2014 Document Reviewed: 06/25/2014 Elsevier Interactive Patient Education 2016 [employer]  

## 2015-01-14 LAB — ABO/RH: ABO/RH(D): O POS

## 2015-01-15 ENCOUNTER — Emergency Department (HOSPITAL_COMMUNITY): Payer: Medicaid Other

## 2015-01-15 ENCOUNTER — Encounter (HOSPITAL_COMMUNITY): Payer: Self-pay | Admitting: *Deleted

## 2015-01-15 ENCOUNTER — Emergency Department (HOSPITAL_COMMUNITY)
Admission: EM | Admit: 2015-01-15 | Discharge: 2015-01-16 | Disposition: A | Discharge status: Home / Self Care | Payer: Medicaid Other | Attending: Emergency Medicine | Admitting: Emergency Medicine

## 2015-01-15 DIAGNOSIS — R059 Cough, unspecified: Secondary | ICD-10-CM

## 2015-01-15 DIAGNOSIS — Z3202 Encounter for pregnancy test, result negative: Secondary | ICD-10-CM | POA: Insufficient documentation

## 2015-01-15 DIAGNOSIS — R5383 Other fatigue: Secondary | ICD-10-CM | POA: Insufficient documentation

## 2015-01-15 DIAGNOSIS — R079 Chest pain, unspecified: Secondary | ICD-10-CM

## 2015-01-15 DIAGNOSIS — R05 Cough: Secondary | ICD-10-CM | POA: Insufficient documentation

## 2015-01-15 DIAGNOSIS — R51 Headache: Secondary | ICD-10-CM | POA: Insufficient documentation

## 2015-01-15 DIAGNOSIS — H81392 Other peripheral vertigo, left ear: Secondary | ICD-10-CM

## 2015-01-15 DIAGNOSIS — R062 Wheezing: Secondary | ICD-10-CM | POA: Insufficient documentation

## 2015-01-15 DIAGNOSIS — R0602 Shortness of breath: Secondary | ICD-10-CM | POA: Insufficient documentation

## 2015-01-15 DIAGNOSIS — Z862 Personal history of diseases of the blood and blood-forming organs and certain disorders involving the immune mechanism: Secondary | ICD-10-CM | POA: Insufficient documentation

## 2015-01-15 DIAGNOSIS — R6883 Chills (without fever): Secondary | ICD-10-CM | POA: Insufficient documentation

## 2015-01-15 LAB — BASIC METABOLIC PANEL
ANION GAP: 8 (ref 5–15)
BUN: 6 mg/dL (ref 6–20)
CO2: 26 mmol/L (ref 22–32)
Calcium: 9.3 mg/dL (ref 8.9–10.3)
Chloride: 102 mmol/L (ref 101–111)
Creatinine, Ser: 0.64 mg/dL (ref 0.44–1.00)
Glucose, Bld: 90 mg/dL (ref 65–99)
POTASSIUM: 3.8 mmol/L (ref 3.5–5.1)
SODIUM: 136 mmol/L (ref 135–145)

## 2015-01-15 LAB — CBC
HEMATOCRIT: 38.4 % (ref 36.0–46.0)
HEMOGLOBIN: 12.9 g/dL (ref 12.0–15.0)
MCH: 28 pg (ref 26.0–34.0)
MCHC: 33.6 g/dL (ref 30.0–36.0)
MCV: 83.3 fL (ref 78.0–100.0)
Platelets: 309 10*3/uL (ref 150–400)
RBC: 4.61 MIL/uL (ref 3.87–5.11)
RDW: 13.1 % (ref 11.5–15.5)
WBC: 10.6 10*3/uL — AB (ref 4.0–10.5)

## 2015-01-15 LAB — POC URINE PREG, ED: PREG TEST UR: NEGATIVE

## 2015-01-15 LAB — I-STAT TROPONIN, ED: Troponin i, poc: 0 ng/mL (ref 0.00–0.08)

## 2015-01-15 MED ORDER — SODIUM CHLORIDE 0.9 % IV BOLUS (SEPSIS)
1000.0000 mL | Freq: Once | INTRAVENOUS | Status: AC
  Administered 2015-01-15: 1000 mL via INTRAVENOUS

## 2015-01-15 MED ORDER — DIPHENHYDRAMINE HCL 50 MG/ML IJ SOLN
25.0000 mg | Freq: Once | INTRAMUSCULAR | Status: AC
  Administered 2015-01-15: 25 mg via INTRAVENOUS
  Filled 2015-01-15: qty 1

## 2015-01-15 MED ORDER — ALBUTEROL SULFATE HFA 108 (90 BASE) MCG/ACT IN AERS
2.0000 | INHALATION_SPRAY | Freq: Once | RESPIRATORY_TRACT | Status: AC
  Administered 2015-01-15: 2 via RESPIRATORY_TRACT
  Filled 2015-01-15: qty 6.7

## 2015-01-15 MED ORDER — PROMETHAZINE HCL 25 MG/ML IJ SOLN
25.0000 mg | Freq: Once | INTRAMUSCULAR | Status: AC
  Administered 2015-01-15: 25 mg via INTRAVENOUS
  Filled 2015-01-15 (×2): qty 1

## 2015-01-15 NOTE — ED Provider Notes (Signed)
CSN: [id number]     Arrival date & time 01/15/15  1717 History   First MD Initiated Contact with Patient 01/15/15 2139     Chief Complaint  Patient presents with  . Chest Pain     (Consider location/radiation/quality/duration/timing/severity/associated sxs/prior Treatment) Patient is a 19 y.o. female presenting with cough. The history is provided by the patient and medical records.  Cough Cough characteristics:  Dry Severity:  Moderate Onset quality:  Gradual Duration:  1 week Timing:  Constant Progression:  Unchanged Chronicity:  New Smoker: no   Context: upper respiratory infection   Relieved by:  Nothing Worsened by:  Activity Ineffective treatments:  Cough suppressants Associated symptoms: chest pain, chills, headaches, shortness of breath, sinus congestion and wheezing   Associated symptoms: no diaphoresis, no fever and no rash   Headaches:    Severity:  Moderate   Chronicity:  Recurrent Risk factors: recent infection     Past Medical History  Diagnosis Date  . No pertinent past medical history   . Anemia    Past Surgical History  Procedure Laterality Date  . No past surgeries    . Dilation and evacuation  04/10/2012    Procedure: DILATATION AND EVACUATION;  Surgeon: Mickel Baas, MD;  Location: [facility];  Service: Gynecology;  Laterality: N/A;  . Dilation and evacuation N/A 06/29/2012    Procedure: DILATATION AND EVACUATION;  Surgeon: Mickel Baas, MD;  Location: [facility];  Service: Gynecology;  Laterality: N/A;  . Cesarean section N/A 02/10/2013    Procedure: CESAREAN SECTION;  Surgeon: Levi Aland, MD;  Location: [facility];  Service: Obstetrics;  Laterality: N/A;   Family History  Problem Relation Age of Onset  . Other Neg Hx   . Diabetes Father   . Diabetes Sister    Social History  Substance Use Topics  . Smoking status: Never Smoker   . Smokeless tobacco: None  . Alcohol Use: Yes     Comment: occasional    OB History    Gravida Para Term  Preterm AB TAB SAB Ectopic Multiple Living   Review of Systems  Constitutional: Positive for chills and fatigue. Negative for fever and diaphoresis.  HENT: Negative for facial swelling.   Respiratory: Positive for cough, shortness of breath and wheezing.   Cardiovascular: Positive for chest pain. Negative for leg swelling.  Gastrointestinal: Negative for abdominal pain.  Genitourinary: Negative for dysuria.  Musculoskeletal: Negative for back pain.  Skin: Negative for rash.  Neurological: Positive for dizziness, light-headedness and headaches. Negative for tremors, syncope and weakness.  Psychiatric/Behavioral: Negative for confusion.      Allergies  Review of patient's allergies indicates no known allergies.  Home Medications   Prior to Admission medications   Medication Sig Start Date End Date Taking? Authorizing Provider  acetaminophen (TYLENOL) 325 MG tablet Take 650 mg by mouth every 6 (six) hours as needed for moderate pain or headache.    Yes Historical Provider, MD  meclizine (ANTIVERT) 50 MG tablet Take 0.5-1 tablets (25-50 mg total) by mouth 3 (three) times daily as needed for dizziness or nausea. 01/16/15   Gavin Pound, MD  naproxen (NAPROSYN) 375 MG tablet Take 1 tablet (375 mg total) by mouth 2 (two) times daily. Patient not taking: Reported on 01/15/2015 01/13/15   Marlon Pel, PA-C  promethazine (PHENERGAN) 25 MG tablet Take 1 tablet (25 mg total) by mouth every 6 (six) hours  as needed for refractory nausea / vomiting. 01/16/15   Gavin PoundJustin Tangela Dolliver, MD   BP 115/53 mmHg  Pulse 88  Temp(Src) 98.7 F (37.1 C) (Oral)  Resp 13  Ht 5\' 3"  (1.6 m)  Wt 200 lb (90.719 kg)  BMI 35.44 kg/m2  SpO2 100%  LMP 12/17/2014 Physical Exam  Constitutional: She is oriented to person, place, and time. She appears well-developed and well-nourished. No distress.  HENT:  Head: Normocephalic and atraumatic.  Right Ear: External ear normal.  Left Ear: External ear  normal.  Nose: Nose normal.  Mouth/Throat: Oropharynx is clear and moist. No oropharyngeal exudate.  Eyes: Conjunctivae and EOM are normal. Pupils are equal, round, and reactive to light. Right eye exhibits no discharge. Left eye exhibits no discharge. No scleral icterus.  Neck: Normal range of motion. Neck supple. No JVD present. No tracheal deviation present. No thyromegaly present.  Cardiovascular: Normal rate, regular rhythm and intact distal pulses.   Pulmonary/Chest: Effort normal and breath sounds normal. No stridor. No respiratory distress. She has no wheezes. She has no rales. She exhibits no tenderness.  Abdominal: Soft. She exhibits no distension.  Musculoskeletal: Normal range of motion. She exhibits no edema or tenderness.  Lymphadenopathy:    She has no cervical adenopathy.  Neurological: She is alert and oriented to person, place, and time. She has normal strength. She displays no atrophy and no tremor. No cranial nerve deficit or sensory deficit. She exhibits normal muscle tone. She displays a negative Romberg sign. She displays no seizure activity. Coordination and gait normal. GCS eye subscore is 4. GCS verbal subscore is 5. GCS motor subscore is 6.  Positive Dix-Hallpike on Left.  Skin: Skin is warm and dry. No rash noted. She is not diaphoretic. No erythema. No pallor.  Psychiatric: She has a normal mood and affect. Her behavior is normal. Judgment and thought content normal.  Nursing note and vitals reviewed.   ED Course  Procedures (including critical care time) Labs Review Labs Reviewed  CBC - Abnormal; Notable for the following:    WBC 10.6 (*)    All other components within normal limits  BASIC METABOLIC PANEL  I-STAT TROPOININ, ED  POC URINE PREG, ED    Imaging Review Dg Chest 2 View  01/15/2015  CLINICAL DATA:  Shortness of breath and chest pain EXAM: CHEST  2 VIEW COMPARISON:  December 27, 2006 FINDINGS: Lungs are clear. Heart size and pulmonary vascularity  are normal. No adenopathy. No pneumothorax. No bone lesions. IMPRESSION: No abnormality noted. Electronically Signed   By: Bretta BangWilliam  Woodruff III M.D.   On: 01/15/2015 18:04   I have personally reviewed and evaluated these images and lab results as part of my medical decision-making.   EKG Interpretation   Date/Time:  Tuesday January 15 2015 17:28:25 EDT Ventricular Rate:  89 PR Interval:  138 QRS Duration: 78 QT Interval:  346 QTC Calculation: 420 R Axis:   80 Text Interpretation:  Normal sinus rhythm with sinus arrhythmia  Nonspecific T wave abnormality Abnormal ECG ED PHYSICIAN INTERPRETATION  AVAILABLE IN [facility] Confirmed by TEST, Record ([id number]) on  01/16/2015 7:05:38 AM      MDM   Final diagnoses:  Cough  Peripheral vertigo, left  Chest pain, unspecified    Pt with URI x 1 week.  HA, CP with cough, Dizziness, and lightheaded.  C/W dehydration.  Also signs of peripheral vertigo.  Pt is PERC criteria negative.  Normal ECG and CXR.    Symptoms improved  with IVF, and MG cocktail with anticholinergic medications.  HR improved with IVF.  Will dc with medications for vertigo.  CP improved with albuterol for symptomatic cough c/w bronchitis.  No PNA on CXR.  Patient was given return precautions for cough, headaches, and vertigo.  Pt advised on use of medications as applicable.  Advised to return for actely worsening symptoms, inability to take medications, or other acute concerns.  Advised to follow up with PCP in 1 week.  Patient was in agreement with and expressed understanding of follow plan, plan of care, and return precautions.  All questions answered prior to discharge.  Patient was discharged in stable condition, ambulating without difficulty.  Patient care was discussed with my attending, Dr. Verdie Mosher.    Gavin Pound, MD 01/17/15 1112  Lavera Guise, MD 01/18/15 [phone]

## 2015-01-15 NOTE — ED Notes (Signed)
Pt c/o generalized chest pain. States it feels like her heart is swollen and hot. [facility] on her head.

## 2015-01-15 NOTE — ED Notes (Signed)
Called pharmacy and asked to send ordered phenergan.

## 2015-01-15 NOTE — ED Notes (Signed)
EDP at bedside  

## 2015-01-16 MED ORDER — PROMETHAZINE HCL 25 MG PO TABS: 25.0000 mg | ORAL_TABLET | Freq: Four times a day (QID) | ORAL | Status: DC | PRN

## 2015-01-16 MED ORDER — MECLIZINE HCL 50 MG PO TABS: 25.0000 mg | ORAL_TABLET | Freq: Three times a day (TID) | ORAL | Status: DC | PRN

## 2015-01-16 NOTE — Discharge Instructions (Signed)

## 2015-03-25 ENCOUNTER — Encounter (HOSPITAL_COMMUNITY): Payer: Self-pay | Admitting: Family Medicine

## 2015-03-25 ENCOUNTER — Emergency Department (HOSPITAL_COMMUNITY)
Admission: EM | Admit: 2015-03-25 | Discharge: 2015-03-25 | Disposition: A | Discharge status: Home / Self Care | Payer: Medicaid Other | Attending: Emergency Medicine | Admitting: Emergency Medicine

## 2015-03-25 DIAGNOSIS — Z862 Personal history of diseases of the blood and blood-forming organs and certain disorders involving the immune mechanism: Secondary | ICD-10-CM | POA: Insufficient documentation

## 2015-03-25 DIAGNOSIS — H9209 Otalgia, unspecified ear: Secondary | ICD-10-CM | POA: Insufficient documentation

## 2015-03-25 DIAGNOSIS — J069 Acute upper respiratory infection, unspecified: Secondary | ICD-10-CM | POA: Insufficient documentation

## 2015-03-25 DIAGNOSIS — R21 Rash and other nonspecific skin eruption: Secondary | ICD-10-CM | POA: Insufficient documentation

## 2015-03-25 DIAGNOSIS — R1011 Right upper quadrant pain: Secondary | ICD-10-CM | POA: Insufficient documentation

## 2015-03-25 DIAGNOSIS — R197 Diarrhea, unspecified: Secondary | ICD-10-CM | POA: Insufficient documentation

## 2015-03-25 DIAGNOSIS — R1031 Right lower quadrant pain: Secondary | ICD-10-CM | POA: Insufficient documentation

## 2015-03-25 LAB — URINE MICROSCOPIC-ADD ON

## 2015-03-25 LAB — CBC WITH DIFFERENTIAL/PLATELET
Basophils Absolute: 0 10*3/uL (ref 0.0–0.1)
Basophils Relative: 1 %
EOS ABS: 0.1 10*3/uL (ref 0.0–0.7)
Eosinophils Relative: 1 %
HEMATOCRIT: 43.1 % (ref 36.0–46.0)
HEMOGLOBIN: 14.2 g/dL (ref 12.0–15.0)
LYMPHS ABS: 1 10*3/uL (ref 0.7–4.0)
Lymphocytes Relative: 16 %
MCH: 27.9 pg (ref 26.0–34.0)
MCHC: 32.9 g/dL (ref 30.0–36.0)
MCV: 84.7 fL (ref 78.0–100.0)
MONOS PCT: 12 %
Monocytes Absolute: 0.7 10*3/uL (ref 0.1–1.0)
NEUTROS PCT: 70 %
Neutro Abs: 4.4 10*3/uL (ref 1.7–7.7)
Platelets: 276 10*3/uL (ref 150–400)
RBC: 5.09 MIL/uL (ref 3.87–5.11)
RDW: 13.2 % (ref 11.5–15.5)
WBC: 6.2 10*3/uL (ref 4.0–10.5)

## 2015-03-25 LAB — URINALYSIS, ROUTINE W REFLEX MICROSCOPIC
BILIRUBIN URINE: NEGATIVE
GLUCOSE, UA: NEGATIVE mg/dL
Ketones, ur: NEGATIVE mg/dL
Leukocytes, UA: NEGATIVE
Nitrite: NEGATIVE
PH: 5.5 (ref 5.0–8.0)
Protein, ur: NEGATIVE mg/dL
SPECIFIC GRAVITY, URINE: 1.006 (ref 1.005–1.030)

## 2015-03-25 LAB — BASIC METABOLIC PANEL
Anion gap: 10 (ref 5–15)
BUN: 7 mg/dL (ref 6–20)
CHLORIDE: 105 mmol/L (ref 101–111)
CO2: 25 mmol/L (ref 22–32)
CREATININE: 0.78 mg/dL (ref 0.44–1.00)
Calcium: 9.7 mg/dL (ref 8.9–10.3)
GFR calc Af Amer: >60 mL/min (ref >60)
GFR calc non Af Amer: >60 mL/min (ref >60)
GLUCOSE: 104 mg/dL — AB (ref 65–99)
Potassium: 4.3 mmol/L (ref 3.5–5.1)
SODIUM: 140 mmol/L (ref 135–145)

## 2015-03-25 LAB — RAPID STREP SCREEN (MED CTR MEBANE ONLY): Streptococcus, Group A Screen (Direct): NEGATIVE

## 2015-03-25 MED ORDER — OSELTAMIVIR PHOSPHATE 75 MG PO CAPS: 75.0000 mg | ORAL_CAPSULE | Freq: Two times a day (BID) | ORAL | Status: DC

## 2015-03-25 MED ORDER — IBUPROFEN 400 MG PO TABS
800.0000 mg | ORAL_TABLET | Freq: Once | ORAL | Status: AC
  Administered 2015-03-25: 800 mg via ORAL
  Filled 2015-03-25: qty 2

## 2015-03-25 MED ORDER — PREDNISONE 20 MG PO TABS
60.0000 mg | ORAL_TABLET | Freq: Once | ORAL | Status: AC
  Administered 2015-03-25: 60 mg via ORAL
  Filled 2015-03-25: qty 3

## 2015-03-25 NOTE — Discharge Instructions (Signed)
[name],  Nice meeting you! Please follow-up with your primary care provider. Attached is a list of local providers. Return to the emergency department if you develop increased pain, fevers, chills, nausea/vomiting, or vaginal complaints. Follow up with the primary care provider regarding the blood in your urine. Feel better soon!  S. Lane HackerNicole Khailee Mick, PA-C   Emergency Department Resource Guide 1) Find a Doctor and Pay Out of Pocket Although you won't have to find out who is covered by your insurance plan, it is a good idea to ask around and get recommendations. You will then need to call the office and see if the doctor you have chosen will accept you as a new patient and what types of options they offer for patients who are self-pay. Some doctors offer discounts or will set up payment plans for their patients who do not have insurance, but you will need to ask so you aren't surprised when you get to your appointment.  2) Contact Your Local Health Department Not all health departments have doctors that can see patients for sick visits, but many do, so it is worth a call to see if yours does. If you don't know where your local health department is, you can check in your phone book. The CDC also has a tool to help you locate your state's health department, and many state websites also have listings of all of their local health departments.  3) Find a Walk-in Clinic If your illness is not likely to be very severe or complicated, you may want to try a walk in clinic. These are popping up all over the country in pharmacies, drugstores, and shopping centers. They're usually staffed by nurse practitioners or physician assistants that have been trained to treat common illnesses and complaints. They're usually fairly quick and inexpensive. However, if you have serious medical issues or chronic medical problems, these are probably not your best option.  No Primary Care Doctor: - Call Health Connect  at  [phone] - they can help you locate a primary care doctor that  accepts your insurance, provides certain services, etc. - Physician Referral Service- [phone]  Chronic Pain Problems: Organization         Address  Phone   Notes  [facility]  [phone] Patients need to be referred by their primary care doctor.   Medication Assistance: Organization         Address  Phone   Notes  [facility] [address] [place], [place] [address] [phone] --Must be a resident of [place] -- Must have NO insurance coverage whatsoever (no Medicaid/ Medicare, etc.) -- The pt. MUST have a primary care doctor that directs their care regularly and follows them in the community   MedAssist  [phone]   [employer]  [phone]) [phone]    Agencies that provide inexpensive medical care: Organization         Address  Phone   Notes  [facility]  [phone]   [facility]    [phone]   [facility] [address] [place], [place] [address] [phone]   [facility] [address], [place] [phone]   Planned Parenthood    [phone]   [facility]    [phone]   [facility] and [facility]  [address], [place] Phone:  [phone],  Fax:  [phone] Hours of Operation:  9 am - 6 pm, M-F.  Also accepts Medicaid/Medicare and self-pay.  [facility] for [place], [address], [place] Phone: [phone], Fax: [phone]. Hours of Operation:  8:30 am - 5:30 pm, M-F.  Also accepts Medicaid and self-pay.  [facility] [place] [address] [place] Phone: [phone]   [place], [place], [place] [phone], Ext. [phone] Mondays & Thursdays: 7-9 AM.  First 15 patients are seen on a first come, first serve basis.     [place] Providers:  Organization         Address  Phone   Notes  [facility] [address], [place] [phone] Also accepts self-pay patients.  [facility] [address], [place]  [phone]   [place], [address], [place] [phone]   [facility] [address], [place] [phone]   Lucianne Lei [address], [place]   [phone] Only accepts [place] Access [place] patients after they have their name applied to their card.   Self-Pay (no insurance) in [facility]:  Organization         Address  Phone   Notes  Sickle Cell Patients, [facility] [place] [phone]   [facility] [place] [phone]   [facility] [place]  [place], [place], [place] [phone]   [facility]/Dr. Osei-Bonsu  [address], [place] or [address], [place] [phone] Phone number for both [place] and [place] locations is the same.  Urgent Medical and [facility] [address] [place] [phone]   [facility] [address] [place] or [address] [phone]   [facility] [address], [place] [phone], phone; [phone], fax Sees patients 1st and 3rd Saturday of every month.  Must not qualify for public or private insurance (i.e. Medicaid, Medicare, [place] Health Choice, Veterans' Benefits)  Household income should be no more than 200% of the poverty level The clinic cannot treat you if you are pregnant or think you are pregnant  Sexually transmitted diseases are not treated at the clinic.    Dental Care: Organization         Address  Phone  Notes  [facility]  Department of [facility] [place] [phone] Accepts children up to age 78 who are enrolled in [place] or [place]; pregnant women with a Medicaid card; and children who have applied for Medicaid or [place] Health Choice, but were declined, whose parents can pay a reduced fee at time of service.  [facility] Department of [facility]  [address], [place] [phone] Accepts children up to age 26 who are enrolled in [place] or [place]; pregnant women with a Medicaid card; and children who have applied for Medicaid or [place] Health Choice, but were declined, whose parents can pay a reduced fee at time of service.  [place] [facility] Access PROGRAM  [place] [phone] Patients are seen by appointment only. Walk-ins are not  accepted. [place] will see patients 36 years of age and older. Monday - Tuesday (8am-5pm) Most Wednesdays (8:30-5pm) $30 per visit, cash only  [facility] Access PROGRAM  [address], [facility] [phone] Patients are seen by appointment only. Walk-ins are not accepted. [place] will see patients 36 years of age and older. One Wednesday Evening (Monthly: Volunteer Based).  $30 per visit, cash only  [place]  [phone] for adults; Children under age 81, call Graduate Pediatric Dentistry at [phone]. Children aged 62-14, please call [phone] to request a pediatric application.  Dental services are provided in all areas of dental care including fillings, crowns and bridges, complete and partial dentures, implants, gum treatment, root canals, and extractions. Preventive care is also provided. Treatment is provided to both adults and children. Patients are selected via a lottery and there is often a waiting list.   [facility] [address] [place]  [phone]  [email or web address].[email or web address]   [facility] [address] [place], [place] [phone], Ext. [phone] Second and Fourth Thursday of each month, opens at 6:30 AM; Clinic ends at 9 AM.  Patients are seen on a first-come first-served basis, and a limited number are seen during each clinic.   [facility]  [address] Hillard Danker [place], [place] [phone]   Eligibility Requirements You must have lived in [place], [place], or [place] counties for at least the last three months.   You cannot be eligible for state or federal sponsored [employer], including [employer], [place], or [employer].   You generally cannot be eligible for healthcare insurance through your employer.    How to apply: Eligibility screenings are held every Tuesday and Wednesday afternoon from 1:00 pm until 4:00 pm. You do not need an appointment for the interview!  [facility] [address] [place], [place]   [place]  [place] Department  [place]  [phone]    Behavioral Health Resources in the Community: Intensive Outpatient Programs Organization         Address  Phone  Notes  [facility] [place]. [address] [place], [place] [phone]   [facility]patient [address], [place], [place]   ADS: Alcohol & Drug Svcs [address], [place], [place]   [place] [address]  [place], [place] or [phone]   Substance Abuse Resources Organization         Address  Phone  Notes  Alcohol and Drug Services  [phone]   [place]  [phone]   [facility] [place]   Chinita Pester  [phone]   Residential & Outpatient Substance Abuse Program  [phone]   Psychological Services Organization          Address  Phone  Notes  [facility] [place]  [place]  [phone]   [place] [address], [place] or [phone]    Mobile Crisis Teams Organization         Address  Phone  Notes  [facility], Mobile Crisis Care Unit  [phone]   [facility] Psychotherapeutic [facility]  [address] [place], [place]   [place] [address], [place] [phone]    Self-Help/Support Groups  Organization         Address  Phone             Notes  [facility] [place] - variety of support groups  [place] Call for more information  Narcotics Anonymous (NA), [facility] [address], [employer] [place]  2 meetings at this location   Special educational needs teacher         Address  Phone  Notes  ASAP Residential Treatment [place],    [place]  [phone]   [facility]  [address], [place] [id number], [place], [place]   [place], [place] [phone] Admissions: 8am-3pm M-F  Incentives Substance [place] [address]    [place], [place] [phone]   [facility] [address] [place], [place], [place]   [facility] [address]  [place], [place]   [facility] Programs - Intensive Outpatient [address], [place], [place]   [facility].,  [place], [place] [phone] or [phone]   [facility] Services (RTS) [address], [place], [place] Accepts Medicaid  [facility] [place] [address],  [place] [phone] Substance Abuse/Addiction Treatment   [facility] Organization         Address  Phone  Notes  [facility]  [phone]   Domenic Schwab, PhD [address] Arlis [place]a [place], [place]   [phone] or [phone]   [place], [place] [phone]   [facility] [address] [place], [place] [phone] Insurance/Medicaid/sponsorship through [facility] and Families [address], [address] [place]                                    [place], [place] [phone] [place] [address] [place], [place] [phone]    Dr. Adele Schilder  [phone]   [facility] Dept. 1) [address], [place] 2) [place] 3)  [place] [address], [place] [phone]  [phone]   [place] [phone] or [phone] (After Hours)

## 2015-03-25 NOTE — ED Notes (Signed)
Declined W/C at D/C and was escorted to lobby by RN. 

## 2015-03-25 NOTE — ED Provider Notes (Signed)
CSN: [id number]     Arrival date & time 03/25/15  1313 History  By signing my name below, I, Ruth Gross, attest that this documentation has been prepared under the direction and in the presence of Ruth HackerNicole Floye Fesler, PA-C. Electronically Signed: Phillis HaggisGabriella Gross, ED Scribe. 03/25/2015. 2:34 PM.  Chief Complaint  Patient presents with  . Cough  . Sore Throat   Patient is a 20 y.o. female presenting with cough and pharyngitis. The history is provided by the patient. No language interpreter was used.  Cough Cough characteristics:  Non-productive Severity:  Mild Onset quality:  Sudden Duration:  1 day Timing:  Constant Progression:  Worsening Chronicity:  New Context: not sick contacts   Ineffective treatments:  None tried Associated symptoms: chills, ear pain, fever, headaches, myalgias, rash and sore throat   Sore Throat Associated symptoms include headaches.  HPI Comments: Ruth Gross is a 20 y.o. female who presents to the Emergency Department complaining of cough onset one day ago. Pt reports associated generalized myalgias, rash to the bilateral forearms, generalized headache, diarrhea, fever x1, sore throat and swollen tonsils. She has not taken anything for her symptoms. She denies sick contacts, vomiting, vaginal odor, vaginal discharge. Pt recently ended her menstrual period.   Past Medical History  Diagnosis Date  . No pertinent past medical history   . Anemia    Past Surgical History  Procedure Laterality Date  . No past surgeries    . Dilation and evacuation  04/10/2012    Procedure: DILATATION AND EVACUATION;  Surgeon: Mickel Baasichard D Kaplan, MD;  Location: [facility];  Service: Gynecology;  Laterality: N/A;  . Dilation and evacuation N/A 06/29/2012    Procedure: DILATATION AND EVACUATION;  Surgeon: Mickel Baasichard D Kaplan, MD;  Location: [facility];  Service: Gynecology;  Laterality: N/A;  . Cesarean section N/A 02/10/2013    Procedure: CESAREAN SECTION;  Surgeon: Levi AlandMark E Anderson, MD;   Location: [facility];  Service: Obstetrics;  Laterality: N/A;   Family History  Problem Relation Age of Onset  . Other Neg Hx   . Diabetes Father   . Diabetes Sister    Social History  Substance Use Topics  . Smoking status: Never Smoker   . Smokeless tobacco: None  . Alcohol Use: Yes     Comment: occasional    OB History    Gravida Para Term Preterm AB TAB SAB Ectopic Multiple Living   2 1 1  1  1   1      Review of Systems  Constitutional: Positive for fever and chills.  HENT: Positive for ear pain and sore throat.   Respiratory: Positive for cough.   Gastrointestinal: Positive for nausea and diarrhea.  Musculoskeletal: Positive for myalgias.  Skin: Positive for rash.  Neurological: Positive for headaches.  All other systems reviewed and are negative.   Allergies  Review of patient's allergies indicates no known allergies.  Home Medications   Prior to Admission medications   Medication Sig Start Date End Date Taking? Authorizing Provider  acetaminophen (TYLENOL) 325 MG tablet Take 650 mg by mouth every 6 (six) hours as needed for moderate pain or headache.     Historical Provider, MD  meclizine (ANTIVERT) 50 MG tablet Take 0.5-1 tablets (25-50 mg total) by mouth 3 (three) times daily as needed for dizziness or nausea. 01/16/15   Gavin PoundJustin Brooten, MD  naproxen (NAPROSYN) 375 MG tablet Take 1 tablet (375 mg total) by mouth 2 (two) times daily. Patient not taking: Reported on 01/15/2015  01/13/15   Tiffany Neva Seat, PA-C  promethazine (PHENERGAN) 25 MG tablet Take 1 tablet (25 mg total) by mouth every 6 (six) hours as needed for refractory nausea / vomiting. 01/16/15   Gavin Pound, MD   BP 108/97 mmHg  Pulse 91  Temp(Src) 98.4 F (36.9 C)  Resp 18  SpO2 99%  LMP 03/19/2015 Physical Exam  Constitutional: She appears well-developed and well-nourished. No distress.  HENT:  Head: Normocephalic and atraumatic.  Right Ear: External ear normal.  Left Ear: External ear normal.   Nose: Nose normal.  Mouth/Throat: Oropharynx is clear and moist. No oropharyngeal exudate.  Eyes: Conjunctivae are normal. Pupils are equal, round, and reactive to light. Right eye exhibits no discharge. Left eye exhibits no discharge. No scleral icterus.  Neck: Normal range of motion. No tracheal deviation present.  Cardiovascular: Normal rate, regular rhythm, normal heart sounds and intact distal pulses.  Exam reveals no gallop and no friction rub.   No murmur heard. Pulmonary/Chest: Effort normal and breath sounds normal. No respiratory distress. She has no wheezes. She has no rales. She exhibits no tenderness.  Abdominal: Soft. Bowel sounds are normal. She exhibits no distension and no mass. There is tenderness. There is rebound. There is no guarding.  RUQ and RLQ tenderness  Musculoskeletal: She exhibits no edema.  Lymphadenopathy:    She has no cervical adenopathy.  Neurological: She is alert. Coordination normal.  Skin: Skin is warm and dry. No rash noted. She is not diaphoretic. No erythema.  Psychiatric: She has a normal mood and affect. Her behavior is normal.  Nursing note and vitals reviewed.   ED Course  Procedures  DIAGNOSTIC STUDIES: Oxygen Saturation is 99% on RA, normal by my interpretation.    COORDINATION OF CARE: 2:34 PM-Discussed treatment plan with pt at bedside and pt agreed to plan.   Labs Review Labs Reviewed  BASIC METABOLIC PANEL - Abnormal; Notable for the following:    Glucose, Bld 104 (*)    All other components within normal limits  URINALYSIS, ROUTINE W REFLEX MICROSCOPIC (NOT AT [facility]) - Abnormal; Notable for the following:    Hgb urine dipstick TRACE (*)    All other components within normal limits  URINE MICROSCOPIC-ADD ON - Abnormal; Notable for the following:    Squamous Epithelial / LPF 6-30 (*)    Bacteria, UA RARE (*)    All other components within normal limits  RAPID STREP SCREEN (NOT AT [facility])  CULTURE, GROUP A STREP  CBC WITH  DIFFERENTIAL/PLATELET   MDM   Final diagnoses:  Upper respiratory infection   Most likely URI, viral in etiology. Appendicitis and gallbladder less likely. CBC, BMP, rapid strep unremarkable. UA with trace hematuria. Discussed results with patient. Patient may be safely discharged home. Discussed reasons for return, especially regarding abdominal tenderness. Patient to follow-up with primary care provider within one week. Patient in understanding and agreement with the plan.  I personally performed the services described in this documentation, which was scribed in my presence. The recorded information has been reviewed and is accurate.   Melton Krebs, PA-C 03/27/15 1340  Margarita Grizzle, MD 03/31/15 [id number]

## 2015-03-25 NOTE — ED Notes (Signed)
Pt here for cough, sore throat, body aches and swollen tonsils.

## 2015-03-27 LAB — CULTURE, GROUP A STREP

## 2015-06-06 ENCOUNTER — Emergency Department (HOSPITAL_BASED_OUTPATIENT_CLINIC_OR_DEPARTMENT_OTHER)
Admission: EM | Admit: 2015-06-06 | Discharge: 2015-06-07 | Disposition: A | Discharge status: Home / Self Care | Payer: No Typology Code available for payment source | Attending: Emergency Medicine | Admitting: Emergency Medicine

## 2015-06-06 ENCOUNTER — Encounter (HOSPITAL_BASED_OUTPATIENT_CLINIC_OR_DEPARTMENT_OTHER): Payer: Self-pay | Admitting: *Deleted

## 2015-06-06 DIAGNOSIS — R109 Unspecified abdominal pain: Secondary | ICD-10-CM | POA: Insufficient documentation

## 2015-06-06 NOTE — ED Notes (Signed)
To ED via EMS with abdominal pain and vomiting. Diarrhea with blood in there stool earlier today.

## 2015-06-06 NOTE — ED Notes (Signed)
Was assisting pt to the bathroom, when pt was transferring from the wheel chair to the toilet she sat in the floor while holding on to the rails with a strong assist.Triage  RN present when pt stood up with no assist and sat on the toilet.  She was able to pull her pant down and pull them up after using the toilet with no assistance per RN

## 2015-06-06 NOTE — ED Notes (Signed)
Pt observed talking on the phone in the waiting area. She is in no noted distress.

## 2015-11-09 ENCOUNTER — Encounter (HOSPITAL_COMMUNITY): Payer: Self-pay | Admitting: Emergency Medicine

## 2015-11-09 ENCOUNTER — Emergency Department (HOSPITAL_COMMUNITY)
Admission: EM | Admit: 2015-11-09 | Discharge: 2015-11-09 | Disposition: A | Discharge status: Home / Self Care | Payer: Medicaid Other | Attending: Emergency Medicine | Admitting: Emergency Medicine

## 2015-11-09 ENCOUNTER — Emergency Department (HOSPITAL_COMMUNITY): Payer: Medicaid Other

## 2015-11-09 DIAGNOSIS — M542 Cervicalgia: Secondary | ICD-10-CM

## 2015-11-09 DIAGNOSIS — J069 Acute upper respiratory infection, unspecified: Secondary | ICD-10-CM | POA: Insufficient documentation

## 2015-11-09 DIAGNOSIS — R599 Enlarged lymph nodes, unspecified: Secondary | ICD-10-CM | POA: Diagnosis present

## 2015-11-09 DIAGNOSIS — R590 Localized enlarged lymph nodes: Secondary | ICD-10-CM | POA: Diagnosis not present

## 2015-11-09 LAB — BASIC METABOLIC PANEL
Anion gap: 5 (ref 5–15)
BUN: 7 mg/dL (ref 6–20)
CALCIUM: 9.4 mg/dL (ref 8.9–10.3)
CO2: 29 mmol/L (ref 22–32)
Chloride: 105 mmol/L (ref 101–111)
Creatinine, Ser: 0.7 mg/dL (ref 0.44–1.00)
GFR calc Af Amer: >60 mL/min (ref >60)
GLUCOSE: 107 mg/dL — AB (ref 65–99)
POTASSIUM: 4.3 mmol/L (ref 3.5–5.1)
SODIUM: 139 mmol/L (ref 135–145)

## 2015-11-09 LAB — CBC WITH DIFFERENTIAL/PLATELET
BASOS ABS: 0 10*3/uL (ref 0.0–0.1)
Basophils Relative: 0 %
EOS ABS: 0.2 10*3/uL (ref 0.0–0.7)
EOS PCT: 1 %
HCT: 43.2 % (ref 36.0–46.0)
Hemoglobin: 13.9 g/dL (ref 12.0–15.0)
LYMPHS ABS: 3.7 10*3/uL (ref 0.7–4.0)
LYMPHS PCT: 30 %
MCH: 28 pg (ref 26.0–34.0)
MCHC: 32.2 g/dL (ref 30.0–36.0)
MCV: 86.9 fL (ref 78.0–100.0)
MONO ABS: 0.7 10*3/uL (ref 0.1–1.0)
Monocytes Relative: 6 %
Neutro Abs: 7.8 10*3/uL — ABNORMAL HIGH (ref 1.7–7.7)
Neutrophils Relative %: 63 %
PLATELETS: 315 10*3/uL (ref 150–400)
RBC: 4.97 MIL/uL (ref 3.87–5.11)
RDW: 13.1 % (ref 11.5–15.5)
WBC: 12.3 10*3/uL — AB (ref 4.0–10.5)

## 2015-11-09 LAB — I-STAT BETA HCG BLOOD, ED (MC, WL, AP ONLY)

## 2015-11-09 MED ORDER — DIPHENHYDRAMINE HCL 50 MG/ML IJ SOLN
25.0000 mg | Freq: Four times a day (QID) | INTRAMUSCULAR | Status: DC | PRN
  Administered 2015-11-09: 25 mg via INTRAVENOUS
  Filled 2015-11-09: qty 1

## 2015-11-09 MED ORDER — DIPHENHYDRAMINE HCL 50 MG/ML IJ SOLN: 25.0000 mg | INTRAMUSCULAR | Status: DC | PRN

## 2015-11-09 MED ORDER — IOPAMIDOL (ISOVUE-300) INJECTION 61%
INTRAVENOUS | Status: AC
  Administered 2015-11-09: 75 mL
  Filled 2015-11-09: qty 75

## 2015-11-09 MED ORDER — NAPROXEN 375 MG PO TABS
375.0000 mg | ORAL_TABLET | Freq: Two times a day (BID) | ORAL | 0 refills | Status: DC
Start: 2015-11-09 — End: 2016-05-09

## 2015-11-09 NOTE — Discharge Instructions (Signed)
Your CT scan showed mild enlargement of the left tonsil and the lymphnodes in that are that appear to be reactive This seems to represent an acute upper respiratory infection and you may take over the counter cold medications/ cough suppressants. If you develop difficulty breathing or significantly worsening swelling , or inability to swallow your own saliva, please return to the ED.

## 2015-11-09 NOTE — ED Triage Notes (Signed)
Pt sts she has had a swollen lymph node on the left side of her neck. She sts it feels hard to swallow and her left ear hurts. Pt sts subjective fever at home.

## 2015-11-09 NOTE — ED Provider Notes (Signed)
[facility]-EMERGENCY DEPT Provider Note   CSN: [id number] Arrival date & time: 11/09/15  1726  By signing my name below, I, Alyssa GroveMartin Green, attest that this documentation has been prepared under the direction and in the presence of Arthor CaptainAbigail Carleta Woodrow, PA-C. Electronically Signed: Alyssa GroveMartin Green, ED Scribe. 11/09/15. 6:43 PM.  History   Chief Complaint Chief Complaint  Patient presents with  . Otalgia  . Lymphadenopathy   The history is provided by the patient. No language interpreter was used.    HPI Comments: Ruth Gross is a 20 y.o. female who presents to the Emergency Department complaining of gradual onset and worsening, constant left sided neck pain and swelling onset a few days. Pt states this morning when she woke up she couldn't catch her breath. Pt reports associated lightheadedness, left ear pain, trouble swallowing, cough, and fever (T-max 99.7). Pt states pain is exacerbated by movement of her neck. Pt denies injury to neck. She states pain is not similar to ear infection and is felt from her left ear down to her neck. Pt states she has taken ibuprofen with no relief to symptoms. Pt denies hx of smoking. Pt denies sore throat and dental problems.  Past Medical History:  Diagnosis Date  . Anemia   . No pertinent past medical history     Patient Active Problem List   Diagnosis Date Noted  . Supervision of other normal pregnancy 02/11/2013  . Pregnancy 02/09/2013  . Anemia 06/16/2012  . History of miscarriage 06/16/2012    Past Surgical History:  Procedure Laterality Date  . CESAREAN SECTION N/A 02/10/2013   Procedure: CESAREAN SECTION;  Surgeon: Levi AlandMark E Anderson, MD;  Location: [facility];  Service: Obstetrics;  Laterality: N/A;  . DILATION AND EVACUATION  04/10/2012   Procedure: DILATATION AND EVACUATION;  Surgeon: Mickel Baasichard D Kaplan, MD;  Location: [facility];  Service: Gynecology;  Laterality: N/A;  . DILATION AND EVACUATION N/A 06/29/2012   Procedure: DILATATION AND EVACUATION;   Surgeon: Mickel Baasichard D Kaplan, MD;  Location: [facility];  Service: Gynecology;  Laterality: N/A;  . NO PAST SURGERIES      OB History    Gravida Para Term Preterm AB Living   2 1 1   1 1    SAB TAB Ectopic Multiple Live Births   1       1       Home Medications    Prior to Admission medications   Medication Sig Start Date End Date Taking? Authorizing Provider  acetaminophen (TYLENOL) 325 MG tablet Take 650 mg by mouth every 6 (six) hours as needed for moderate pain or headache.     Historical Provider, MD  ALBUTEROL IN Inhale into the lungs.    Historical Provider, MD  meclizine (ANTIVERT) 50 MG tablet Take 0.5-1 tablets (25-50 mg total) by mouth 3 (three) times daily as needed for dizziness or nausea. 01/16/15   Gavin PoundJustin Brooten, MD  naproxen (NAPROSYN) 375 MG tablet Take 1 tablet (375 mg total) by mouth 2 (two) times daily. 11/09/15   Arthor CaptainAbigail Rilie Glanz, PA-C  oseltamivir (TAMIFLU) 75 MG capsule Take 1 capsule (75 mg total) by mouth every 12 (twelve) hours. 03/25/15   Melton KrebsSamantha Nicole Riley, PA-C  promethazine (PHENERGAN) 25 MG tablet Take 1 tablet (25 mg total) by mouth every 6 (six) hours as needed for refractory nausea / vomiting. 01/16/15   Gavin PoundJustin Brooten, MD    Family History Family History  Problem Relation Age of Onset  . Diabetes Father   . Diabetes  Sister   . Other Neg Hx     Social History Social History  Substance Use Topics  . Smoking status: Never Smoker  . Smokeless tobacco: Never Used  . Alcohol use Yes     Comment: occasional      Allergies   Review of patient's allergies indicates no known allergies.   Review of Systems Review of Systems  Constitutional: Positive for fever.  HENT: Positive for ear pain and trouble swallowing. Negative for dental problem and sore throat.   Respiratory: Positive for cough.   Neurological: Positive for light-headedness.   Physical Exam Updated Vital Signs BP 136/82 (BP Location: Left Arm)   Pulse 96   Temp 98.7 F (37.1 C)  (Oral)   Resp 16   LMP 10/26/2015   SpO2 100%   Physical Exam  Constitutional: She appears well-developed and well-nourished.  HENT:  Head: Normocephalic.  Right Ear: Tympanic membrane normal.  Left Ear: Tympanic membrane normal.  No abnormal dentition No tenderness in gums or sublingual region Able to swallow Slight cough  Eyes: Conjunctivae are normal.  Neck:  Exam Limited by body habitus Tenderness in the anterior cervical lymph node distribution and along the left SCM muscle No feeling of auricular adenopathy Exquisitely tender to palpation around the ear Pain with ROM of the neck with left rotation and left flexion  Cardiovascular: Normal rate.   Pulmonary/Chest: Effort normal. No respiratory distress.  Abdominal: She exhibits no distension.  Musculoskeletal: Normal range of motion.  Lymphadenopathy:    She has no cervical adenopathy.  Neurological: She is alert.  Skin: Skin is warm and dry.  Psychiatric: She has a normal mood and affect. Her behavior is normal.  Nursing note and vitals reviewed.   ED Treatments / Results  DIAGNOSTIC STUDIES: Oxygen Saturation is 98% on RA, normal by my interpretation.    COORDINATION OF CARE: 6:33 PM Discussed treatment plan with pt at bedside which includes CT Soft Tissue Neck W Contrast and pt agreed to plan.  Labs (all labs ordered are listed, but only abnormal results are displayed) Labs Reviewed  CBC WITH DIFFERENTIAL/PLATELET - Abnormal; Notable for the following:       Result Value   WBC 12.3 (*)    Neutro Abs 7.8 (*)    All other components within normal limits  BASIC METABOLIC PANEL - Abnormal; Notable for the following:    Glucose, Bld 107 (*)    All other components within normal limits  I-STAT BETA HCG BLOOD, ED ([facility], [facility], [facility] ONLY)    EKG  EKG Interpretation None       Radiology Ct Soft Tissue Neck W Contrast  Result Date: 11/09/2015 CLINICAL DATA:  20 y/o F; left-sided neck pain and headache for 2  days. Sore throat with swallowing. EXAM: CT NECK WITH CONTRAST TECHNIQUE: Multidetector CT imaging of the neck was performed using the standard protocol following the bolus administration of intravenous contrast. CONTRAST:  75mL ISOVUE-300 IOPAMIDOL (ISOVUE-300) INJECTION 61% COMPARISON:  None. FINDINGS: Pharynx and larynx: Oropharyngeal mucosal thickening and palatini tonsil enlargement. No discrete collection is identified. Salivary glands: Normal. Thyroid: Normal. Lymph nodes: Bilateral upper cervical and left submandibular lymphadenopathy is probably reactive. Vascular: Carotid and vertebral arteries are patent. No internal jugular thrombus. Limited intracranial: Normal. Visualized orbits: Normal. Mastoids and visualized paranasal sinuses: Normally aerated. Skeleton: Normal. Upper chest: Normal. IMPRESSION: Pharyngeal mucosal thickening and palatini tonsil enlargement probably represents pharyngitis. No abscess identified. Upper cervical and left submandibular lymphadenopathy is probably reactive. Electronically  Signed   By: Mitzi Hansen M.D.   On: 11/09/2015 21:53    Procedures Procedures (including critical care time)  Medications Ordered in ED Medications  diphenhydrAMINE (BENADRYL) injection 25 mg (25 mg Intravenous Given 11/09/15 2126)  iopamidol (ISOVUE-300) 61 % injection (75 mLs  Contrast Given 11/09/15 2030)     Initial Impression / Assessment and Plan / ED Course  I have reviewed the triage vital signs and the nursing notes.  Pertinent labs & imaging results that were available during my care of the patient were reviewed by me and considered in my medical decision making (see chart for details).  Clinical Course    CT negative for deep space infection.  Likely a viral process. Discussed reasons to seek immediate medical care. patient appears safe for discharge at this time. Return precautions discussed  Final Clinical Impressions(s) / ED Diagnoses   Final diagnoses:    URI, acute  Cervical lymphadenopathy  Neck pain on left side    New Prescriptions Discharge Medication List as of 11/09/2015 10:04 PM     I personally performed the services described in this documentation, which was scribed in my presence. The recorded information has been reviewed and is accurate.        Arthor Captain, PA-C 11/10/15 1610    Arby Barrette, MD 11/10/15 [phone]

## 2015-12-10 ENCOUNTER — Emergency Department (HOSPITAL_COMMUNITY)
Admission: EM | Admit: 2015-12-10 | Discharge: 2015-12-11 | Disposition: A | Discharge status: Home / Self Care | Payer: Medicaid Other | Attending: Emergency Medicine | Admitting: Emergency Medicine

## 2015-12-10 ENCOUNTER — Encounter (HOSPITAL_COMMUNITY): Payer: Self-pay

## 2015-12-10 ENCOUNTER — Emergency Department (HOSPITAL_COMMUNITY): Payer: Medicaid Other

## 2015-12-10 DIAGNOSIS — Z79899 Other long term (current) drug therapy: Secondary | ICD-10-CM | POA: Insufficient documentation

## 2015-12-10 DIAGNOSIS — R0789 Other chest pain: Secondary | ICD-10-CM | POA: Insufficient documentation

## 2015-12-10 DIAGNOSIS — R079 Chest pain, unspecified: Secondary | ICD-10-CM | POA: Diagnosis present

## 2015-12-10 HISTORY — DX: Unspecified asthma, uncomplicated: J45.909

## 2015-12-10 LAB — CBC
HEMATOCRIT: 43 % (ref 36.0–46.0)
Hemoglobin: 13.9 g/dL (ref 12.0–15.0)
MCH: 27.8 pg (ref 26.0–34.0)
MCHC: 32.3 g/dL (ref 30.0–36.0)
MCV: 86 fL (ref 78.0–100.0)
PLATELETS: 346 10*3/uL (ref 150–400)
RBC: 5 MIL/uL (ref 3.87–5.11)
RDW: 12.8 % (ref 11.5–15.5)
WBC: 13.7 10*3/uL — AB (ref 4.0–10.5)

## 2015-12-10 LAB — I-STAT BETA HCG BLOOD, ED (MC, WL, AP ONLY)

## 2015-12-10 LAB — BASIC METABOLIC PANEL
Anion gap: 7 (ref 5–15)
BUN: 10 mg/dL (ref 6–20)
CHLORIDE: 107 mmol/L (ref 101–111)
CO2: 26 mmol/L (ref 22–32)
Calcium: 9.6 mg/dL (ref 8.9–10.3)
Creatinine, Ser: 0.69 mg/dL (ref 0.44–1.00)
GFR calc non Af Amer: >60 mL/min (ref >60)
Glucose, Bld: 99 mg/dL (ref 65–99)
POTASSIUM: 4.2 mmol/L (ref 3.5–5.1)
SODIUM: 140 mmol/L (ref 135–145)

## 2015-12-10 LAB — I-STAT TROPONIN, ED
TROPONIN I, POC: 0 ng/mL (ref 0.00–0.08)
Troponin i, poc: 0 ng/mL (ref 0.00–0.08)

## 2015-12-10 LAB — D-DIMER, QUANTITATIVE (NOT AT ARMC)

## 2015-12-10 MED ORDER — KETOROLAC TROMETHAMINE 30 MG/ML IJ SOLN
30.0000 mg | Freq: Once | INTRAMUSCULAR | Status: AC
  Administered 2015-12-10: 30 mg via INTRAVENOUS
  Filled 2015-12-10: qty 1

## 2015-12-10 MED ORDER — ASPIRIN 81 MG PO CHEW
324.0000 mg | CHEWABLE_TABLET | Freq: Once | ORAL | Status: AC
  Administered 2015-12-11: 324 mg via ORAL
  Filled 2015-12-10: qty 4

## 2015-12-10 MED ORDER — DIAZEPAM 5 MG/ML IJ SOLN
2.5000 mg | Freq: Once | INTRAMUSCULAR | Status: AC
  Administered 2015-12-10: 2.5 mg via INTRAVENOUS
  Filled 2015-12-10: qty 2

## 2015-12-10 MED ORDER — MORPHINE SULFATE (PF) 4 MG/ML IV SOLN
4.0000 mg | Freq: Once | INTRAVENOUS | Status: AC
  Administered 2015-12-10: 4 mg via INTRAVENOUS
  Filled 2015-12-10: qty 1

## 2015-12-10 MED ORDER — ONDANSETRON HCL 4 MG/2ML IJ SOLN
4.0000 mg | Freq: Once | INTRAMUSCULAR | Status: AC
  Administered 2015-12-10: 4 mg via INTRAVENOUS
  Filled 2015-12-10: qty 2

## 2015-12-10 NOTE — ED Triage Notes (Signed)
Pt reports chest pain, worse with inspiration. Pt reports the pain radiates into her neck. No resp distress noted but pt does report it is painful to take a deep breath.

## 2015-12-10 NOTE — ED Provider Notes (Signed)
[facility] Provider Note   CSN: [id number] Arrival date & time: 12/10/15  1655  History   Chief Complaint Chief Complaint  Patient presents with  . Chest Pain   HPI   Ruth Gross is an 20 y.o. female who presents to the ED for evaluation of chest pain. She reports the pain started yesterday. It was constant, went away, and now returned today. The pain starts in her left chest and radiates to her left shoulder and left upper back. She states the pain is stabbing in character. She states the pain also gets so severe it makes her entire body spasm. The pain is worse with deep breaths. Denies diaphoresis. She states it's hard to tell if she is short of breath or the pain is just making it hard to breathe. Denies nausea or vomiting. Denies heavy lifting. Denies cough or URI symptoms. Denies feeling faint or lightheaded. She has not tried anything to alleviate her symptoms. Denies OCP use. Denies leg pain or swelling. She denies recent travel. Denies history of blood clots. Endorses strong family history of CAD and MI. Her father passed way last year at age 22 of an MI and had his first MI at age 20 or 3926. She denies cigarette use. Denies any other illicit drugs.  Past Medical History:  Diagnosis Date  . Anemia   . No pertinent past medical history     Patient Active Problem List   Diagnosis Date Noted  . Supervision of other normal pregnancy 02/11/2013  . Pregnancy 02/09/2013  . Anemia 06/16/2012  . History of miscarriage 06/16/2012    Past Surgical History:  Procedure Laterality Date  . CESAREAN SECTION N/A 02/10/2013   Procedure: CESAREAN SECTION;  Surgeon: Levi AlandMark E Anderson, MD;  Location: [facility];  Service: Obstetrics;  Laterality: N/A;  . DILATION AND EVACUATION  04/10/2012   Procedure: DILATATION AND EVACUATION;  Surgeon: Mickel Baasichard D Kaplan, MD;  Location: [facility];  Service: Gynecology;  Laterality: N/A;  . DILATION AND EVACUATION N/A 06/29/2012   Procedure: DILATATION AND  EVACUATION;  Surgeon: Mickel Baasichard D Kaplan, MD;  Location: [facility];  Service: Gynecology;  Laterality: N/A;  . NO PAST SURGERIES      OB History    Gravida Para Term Preterm AB Living   2 1 1   1 1    SAB TAB Ectopic Multiple Live Births   1       1       Home Medications    Prior to Admission medications   Medication Sig Start Date End Date Taking? Authorizing Provider  acetaminophen (TYLENOL) 325 MG tablet Take 650 mg by mouth every 6 (six) hours as needed for moderate pain or headache.     Historical Provider, MD  ALBUTEROL IN Inhale into the lungs.    Historical Provider, MD  meclizine (ANTIVERT) 50 MG tablet Take 0.5-1 tablets (25-50 mg total) by mouth 3 (three) times daily as needed for dizziness or nausea. 01/16/15   Gavin PoundJustin Brooten, MD  naproxen (NAPROSYN) 375 MG tablet Take 1 tablet (375 mg total) by mouth 2 (two) times daily. 11/09/15   Arthor CaptainAbigail Harris, PA-C  oseltamivir (TAMIFLU) 75 MG capsule Take 1 capsule (75 mg total) by mouth every 12 (twelve) hours. 03/25/15   Melton KrebsSamantha Nicole Riley, PA-C  promethazine (PHENERGAN) 25 MG tablet Take 1 tablet (25 mg total) by mouth every 6 (six) hours as needed for refractory nausea / vomiting. 01/16/15   Gavin PoundJustin Brooten, MD    Family  History Family History  Problem Relation Age of Onset  . Diabetes Father   . Diabetes Sister   . Other Neg Hx     Social History Social History  Substance Use Topics  . Smoking status: Never Smoker  . Smokeless tobacco: Never Used  . Alcohol use Yes     Comment: occasional      Allergies   Review of patient's allergies indicates no known allergies.   Review of Systems Review of Systems 10 Systems reviewed and are negative for acute change except as noted in the HPI.  Physical Exam Updated Vital Signs BP 104/74   Pulse 90   Temp 99.1 F (37.3 C) (Oral)   Resp 17   Ht 5\' 4"  (1.626 m)   Wt 90.7 kg   LMP 12/07/2015 (Within Days)   SpO2 100%   BMI 34.33 kg/m   Physical Exam  Constitutional:  She is oriented to person, place, and time.  Appears uncomfortable  HENT:  Right Ear: External ear normal.  Left Ear: External ear normal.  Nose: Nose normal.  Mouth/Throat: Oropharynx is clear and moist. No oropharyngeal exudate.  Eyes: Conjunctivae are normal.  Neck: Neck supple.  Cardiovascular: Normal rate, regular rhythm, normal heart sounds and intact distal pulses.   Pulmonary/Chest: Effort normal and breath sounds normal. No respiratory distress. She has no wheezes. She exhibits tenderness.  Left anterior upper chest tender to palpation  Abdominal: Soft. Bowel sounds are normal. She exhibits no distension. There is no tenderness. There is no rebound and no guarding.  Musculoskeletal: She exhibits no edema.  Lymphadenopathy:    She has no cervical adenopathy.  Neurological: She is alert and oriented to person, place, and time. No cranial nerve deficit.  Skin: Skin is warm and dry.  Psychiatric: She has a normal mood and affect.  Nursing note and vitals reviewed.    ED Treatments / Results  Labs (all labs ordered are listed, but only abnormal results are displayed) Labs Reviewed  CBC - Abnormal; Notable for the following:       Result Value   WBC 13.7 (*)    All other components within normal limits  BASIC METABOLIC PANEL  D-DIMER, QUANTITATIVE (NOT AT [facility])  I-STAT TROPOININ, ED  I-STAT TROPOININ, ED  I-STAT BETA HCG BLOOD, ED ([facility], [facility], [facility] ONLY)    EKG  EKG Interpretation  Date/Time:  Tuesday December 10 2015 17:00:29 EDT Ventricular Rate:  118 PR Interval:  136 QRS Duration: 78 QT Interval:  320 QTC Calculation: 448 R Axis:   75 Text Interpretation:  Sinus tachycardia Right atrial enlargement Borderline ECG Confirmed by Lincoln Brigham [id number]) on 12/10/2015 9:34:46 PM       Radiology Dg Chest 2 View  Result Date: 12/10/2015 CLINICAL DATA:  Chest pain and posterior left shoulder pain EXAM: CHEST  2 VIEW COMPARISON:  None. FINDINGS: The heart size and  mediastinal contours are within normal limits. Both lungs are clear. The visualized skeletal structures are unremarkable. IMPRESSION: No active cardiopulmonary disease. Electronically Signed   By: Deatra Robinson M.D.   On: 12/10/2015 17:30   Ct Angio Chest Pe W And/or Wo Contrast  Result Date: 12/11/2015 CLINICAL DATA:  Acute onset of generalized pleuritic chest pain, radiating to the left shoulder. Initial encounter. EXAM: CT ANGIOGRAPHY CHEST WITH CONTRAST TECHNIQUE: Multidetector CT imaging of the chest was performed using the standard protocol during bolus administration of intravenous contrast. Multiplanar CT image reconstructions and MIPs were obtained to evaluate the vascular  anatomy. CONTRAST:  80 mL of Isovue 300 IV contrast COMPARISON:  Chest radiograph performed 12/10/2015 FINDINGS: Cardiovascular:  There is no evidence of pulmonary embolus. The heart is unremarkable in appearance. The thoracic aorta is within normal limits. The great vessels are unremarkable in appearance. Mediastinum/Nodes: The mediastinum is unremarkable. No mediastinal lymphadenopathy is seen. No pericardial effusion is identified. The visualized portions of the thyroid gland are unremarkable. No axillary lymphadenopathy is seen. Lungs/Pleura: The lungs appear clear bilaterally. No focal consolidation, pleural effusion or pneumothorax is seen. No masses are identified. Upper Abdomen: The spleen and visualized portions of the liver are unremarkable. The gallbladder is within normal limits. The visualized portions of the pancreas, adrenal glands and kidneys are within normal limits. Musculoskeletal: No acute osseous abnormalities are identified. The visualized musculature is unremarkable in appearance. Review of the MIP images confirms the above findings. IMPRESSION: No evidence of pulmonary embolus.  Lungs appear clear bilaterally. Electronically Signed   By: Roanna Raider M.D.   On: 12/11/2015 01:31    Procedures Procedures  (including critical care time)  Medications Ordered in ED Medications  morphine 4 MG/ML injection 4 mg (not administered)  ondansetron (ZOFRAN) injection 4 mg (not administered)  ketorolac (TORADOL) 30 MG/ML injection 30 mg (30 mg Intravenous Given 12/10/15 2207)  diazepam (VALIUM) injection 2.5 mg (2.5 mg Intravenous Given 12/10/15 2207)     Initial Impression / Assessment and Plan / ED Course  I have reviewed the triage vital signs and the nursing notes.  Pertinent labs & imaging results that were available during my care of the patient were reviewed by me and considered in my medical decision making (see chart for details).  Clinical Course   Delta troponin 0. EKG was in sinus tach otherwise non acute. Doubt ACS. HR has improved. However, pain persists after pain meds. She is very concerned and requesting CT. Her wells score is 1.5 (tachycardic on arrival) and d-dimer negative, does not have other risk factors for PE. However, pt is very anxious and requesting CT.   CT negative. Discussed with pt there are many etiologies of chest pain. Will give rx for supportive meds. Encouraged PCP and cards f/u. ER return precautions given.   Final Clinical Impressions(s) / ED Diagnoses   Final diagnoses:  Chest wall pain    New Prescriptions Discharge Medication List as of 12/11/2015  1:39 AM    START taking these medications   Details  cyclobenzaprine (FLEXERIL) 10 MG tablet Take 1 tablet (10 mg total) by mouth 2 (two) times daily as needed for muscle spasms., Starting Wed 12/11/2015, Print    !! naproxen (NAPROSYN) 500 MG tablet Take 1 tablet (500 mg total) by mouth 2 (two) times daily., Starting Wed 12/11/2015, Print     !! - Potential duplicate medications found. Please discuss with provider.       Carlene Coria, PA-C 12/11/15 [id number]    Tilden Fossa, MD 12/13/15 2255

## 2015-12-11 ENCOUNTER — Emergency Department (HOSPITAL_COMMUNITY): Payer: Medicaid Other

## 2015-12-11 ENCOUNTER — Encounter (HOSPITAL_COMMUNITY): Payer: Self-pay | Admitting: Radiology

## 2015-12-11 MED ORDER — CYCLOBENZAPRINE HCL 10 MG PO TABS: 10.0000 mg | ORAL_TABLET | Freq: Two times a day (BID) | ORAL | 0 refills | Status: DC | PRN

## 2015-12-11 MED ORDER — NAPROXEN 500 MG PO TABS: 500.0000 mg | ORAL_TABLET | Freq: Two times a day (BID) | ORAL | 0 refills | Status: DC

## 2015-12-11 MED ORDER — IOPAMIDOL (ISOVUE-370) INJECTION 76%
INTRAVENOUS | Status: AC
  Administered 2015-12-11: 100 mL
  Filled 2015-12-11: qty 100

## 2015-12-11 NOTE — ED Notes (Signed)
Patient transported to CT 

## 2015-12-11 NOTE — Discharge Instructions (Signed)
Your labs, CT scan, and EKG were normal and reassuring. I have given you a couple prescriptions to take as needed for pain. Follow up with cardiology and primary care. Return to the ER for new or worsening symptoms.

## 2015-12-11 NOTE — ED Notes (Signed)
Pt request  Work note for 09/26-09/27; PA ok'd RN to print and sign work note

## 2016-02-04 LAB — OB RESULTS CONSOLE ABO/RH: RH Type: POSITIVE

## 2016-02-04 LAB — OB RESULTS CONSOLE GC/CHLAMYDIA
CHLAMYDIA, DNA PROBE: NEGATIVE
Gonorrhea: NEGATIVE

## 2016-02-04 LAB — OB RESULTS CONSOLE RPR: RPR: NONREACTIVE

## 2016-02-04 LAB — OB RESULTS CONSOLE HEPATITIS B SURFACE ANTIGEN: Hepatitis B Surface Ag: NEGATIVE

## 2016-02-04 LAB — OB RESULTS CONSOLE RUBELLA ANTIBODY, IGM: Rubella: IMMUNE

## 2016-02-04 LAB — OB RESULTS CONSOLE HIV ANTIBODY (ROUTINE TESTING): HIV: NONREACTIVE

## 2016-02-04 LAB — OB RESULTS CONSOLE ANTIBODY SCREEN: Antibody Screen: NEGATIVE

## 2016-03-04 ENCOUNTER — Encounter (HOSPITAL_COMMUNITY): Payer: Self-pay | Admitting: Neurology

## 2016-03-04 ENCOUNTER — Emergency Department (HOSPITAL_COMMUNITY)
Admission: EM | Admit: 2016-03-04 | Discharge: 2016-03-05 | Disposition: A | Discharge status: Home / Self Care | Payer: Medicaid Other | Attending: Emergency Medicine | Admitting: Emergency Medicine

## 2016-03-04 DIAGNOSIS — Z3A13 13 weeks gestation of pregnancy: Secondary | ICD-10-CM | POA: Diagnosis not present

## 2016-03-04 DIAGNOSIS — K529 Noninfective gastroenteritis and colitis, unspecified: Secondary | ICD-10-CM | POA: Diagnosis not present

## 2016-03-04 DIAGNOSIS — O99611 Diseases of the digestive system complicating pregnancy, first trimester: Secondary | ICD-10-CM | POA: Insufficient documentation

## 2016-03-04 DIAGNOSIS — O219 Vomiting of pregnancy, unspecified: Secondary | ICD-10-CM | POA: Diagnosis present

## 2016-03-04 DIAGNOSIS — Z79899 Other long term (current) drug therapy: Secondary | ICD-10-CM | POA: Diagnosis not present

## 2016-03-04 LAB — COMPREHENSIVE METABOLIC PANEL
ALK PHOS: 72 U/L (ref 38–126)
ALT: 30 U/L (ref 14–54)
ANION GAP: 8 (ref 5–15)
AST: 30 U/L (ref 15–41)
Albumin: 3.5 g/dL (ref 3.5–5.0)
BUN: <5 mg/dL — ABNORMAL LOW (ref 6–20)
CALCIUM: 9.5 mg/dL (ref 8.9–10.3)
CHLORIDE: 104 mmol/L (ref 101–111)
CO2: 23 mmol/L (ref 22–32)
Creatinine, Ser: 0.5 mg/dL (ref 0.44–1.00)
GFR calc non Af Amer: >60 mL/min (ref >60)
Glucose, Bld: 98 mg/dL (ref 65–99)
Potassium: 3.4 mmol/L — ABNORMAL LOW (ref 3.5–5.1)
SODIUM: 135 mmol/L (ref 135–145)
Total Bilirubin: 0.3 mg/dL (ref 0.3–1.2)
Total Protein: 7.3 g/dL (ref 6.5–8.1)

## 2016-03-04 LAB — URINALYSIS, ROUTINE W REFLEX MICROSCOPIC
Bilirubin Urine: NEGATIVE
Glucose, UA: NEGATIVE mg/dL
HGB URINE DIPSTICK: NEGATIVE
Ketones, ur: 80 mg/dL — AB
LEUKOCYTES UA: NEGATIVE
Nitrite: NEGATIVE
Protein, ur: NEGATIVE mg/dL
SPECIFIC GRAVITY, URINE: 1.015 (ref 1.005–1.030)
pH: 6 (ref 5.0–8.0)

## 2016-03-04 LAB — I-STAT BETA HCG BLOOD, ED (MC, WL, AP ONLY)

## 2016-03-04 LAB — CBC
HCT: 36.5 % (ref 36.0–46.0)
HEMOGLOBIN: 12.5 g/dL (ref 12.0–15.0)
MCH: 27.7 pg (ref 26.0–34.0)
MCHC: 34.2 g/dL (ref 30.0–36.0)
MCV: 80.8 fL (ref 78.0–100.0)
Platelets: 297 10*3/uL (ref 150–400)
RBC: 4.52 MIL/uL (ref 3.87–5.11)
RDW: 12.9 % (ref 11.5–15.5)
WBC: 6.2 10*3/uL (ref 4.0–10.5)

## 2016-03-04 LAB — LIPASE, BLOOD: LIPASE: 18 U/L (ref 11–51)

## 2016-03-04 LAB — I-STAT CG4 LACTIC ACID, ED
LACTIC ACID, VENOUS: 0.9 mmol/L (ref 0.5–1.9)
Lactic Acid, Venous: 0.74 mmol/L (ref 0.5–1.9)

## 2016-03-04 MED ORDER — ONDANSETRON HCL 4 MG/2ML IJ SOLN
4.0000 mg | Freq: Once | INTRAMUSCULAR | Status: AC
  Administered 2016-03-04: 4 mg via INTRAVENOUS
  Filled 2016-03-04: qty 2

## 2016-03-04 MED ORDER — SODIUM CHLORIDE 0.9 % IV BOLUS (SEPSIS)
1000.0000 mL | Freq: Once | INTRAVENOUS | Status: AC
  Administered 2016-03-04: 1000 mL via INTRAVENOUS

## 2016-03-04 MED ORDER — ONDANSETRON 8 MG PO TBDP: ORAL_TABLET | ORAL | 0 refills | Status: DC

## 2016-03-04 MED ORDER — ACETAMINOPHEN 500 MG PO TABS
1000.0000 mg | ORAL_TABLET | Freq: Once | ORAL | Status: AC
  Administered 2016-03-04: 1000 mg via ORAL
  Filled 2016-03-04: qty 2

## 2016-03-04 NOTE — ED Provider Notes (Signed)
[facility]ENCY DEPT Provider Note   CSN: [id number] Arrival date & time: 03/04/16  1628     History   Chief Complaint Chief Complaint  Patient presents with  . Cough  . Emesis    HPI Ruth Gross is a 20 y.o. female.  Patient is a 20 year old female with no significant past medical history. She presents for evaluation of nausea, vomiting, diarrhea. Reports history of cough for the past several days and started with the abdominal issues yesterday. She reports not being able to keep anything down. She denies any bloody stool or vomit. She reports chills but no definite fever. She does report a contacts at home with similar complaints.  The patient is pregnant at approximately [redacted] weeks gestation.   The history is provided by the patient.    Past Medical History:  Diagnosis Date  . Anemia   . Asthma   . No pertinent past medical history     Patient Active Problem List   Diagnosis Date Noted  . Supervision of other normal pregnancy 02/11/2013  . Pregnancy 02/09/2013  . Anemia 06/16/2012  . History of miscarriage 06/16/2012    Past Surgical History:  Procedure Laterality Date  . CESAREAN SECTION N/A 02/10/2013   Procedure: CESAREAN SECTION;  Surgeon: Levi AlandMark E Anderson, MD;  Location: [facility];  Service: Obstetrics;  Laterality: N/A;  . DILATION AND EVACUATION  04/10/2012   Procedure: DILATATION AND EVACUATION;  Surgeon: Mickel Baasichard D Kaplan, MD;  Location: [facility];  Service: Gynecology;  Laterality: N/A;  . DILATION AND EVACUATION N/A 06/29/2012   Procedure: DILATATION AND EVACUATION;  Surgeon: Mickel Baasichard D Kaplan, MD;  Location: [facility];  Service: Gynecology;  Laterality: N/A;  . NO PAST SURGERIES      OB History    Gravida Para Term Preterm AB Living   3 1 1   1 1    SAB TAB Ectopic Multiple Live Births   1       1       Home Medications    Prior to Admission medications   Medication Sig Start Date End Date Taking? Authorizing Provider  cyclobenzaprine (FLEXERIL) 10  MG tablet Take 1 tablet (10 mg total) by mouth 2 (two) times daily as needed for muscle spasms. 12/11/15   Carlene CoriaSerena Y Sam, PA-C  meclizine (ANTIVERT) 50 MG tablet Take 0.5-1 tablets (25-50 mg total) by mouth 3 (three) times daily as needed for dizziness or nausea. Patient not taking: Reported on 12/11/2015 01/16/15   Gavin PoundJustin Brooten, MD  naproxen (NAPROSYN) 375 MG tablet Take 1 tablet (375 mg total) by mouth 2 (two) times daily. Patient not taking: Reported on 12/11/2015 11/09/15   Arthor CaptainAbigail Harris, PA-C  naproxen (NAPROSYN) 500 MG tablet Take 1 tablet (500 mg total) by mouth 2 (two) times daily. 12/11/15   Carlene CoriaSerena Y Sam, PA-C  oseltamivir (TAMIFLU) 75 MG capsule Take 1 capsule (75 mg total) by mouth every 12 (twelve) hours. Patient not taking: Reported on 12/11/2015 03/25/15   Melton KrebsSamantha Nicole Riley, PA-C  promethazine (PHENERGAN) 25 MG tablet Take 1 tablet (25 mg total) by mouth every 6 (six) hours as needed for refractory nausea / vomiting. Patient not taking: Reported on 12/11/2015 01/16/15   Gavin PoundJustin Brooten, MD    Family History Family History  Problem Relation Age of Onset  . Diabetes Father   . Diabetes Sister   . Other Neg Hx     Social History Social History  Substance Use Topics  . Smoking status: Never Smoker  .  Smokeless tobacco: Never Used  . Alcohol use Yes     Comment: occasional      Allergies   Patient has no known allergies.   Review of Systems Review of Systems  All other systems reviewed and are negative.    Physical Exam Updated Vital Signs BP 118/67 (BP Location: Left Arm)   Pulse 113   Temp 100 F (37.8 C) (Oral)   Resp 18   Ht 5\' 4"  (1.626 m)   Wt 195 lb (88.5 kg)   LMP 12/07/2015 (Within Days)   SpO2 99%   BMI 33.47 kg/m   Physical Exam  Constitutional: She is oriented to person, place, and time. She appears well-developed and well-nourished. No distress.  HENT:  Head: Normocephalic and atraumatic.  Mouth/Throat: Oropharynx is clear and moist.  Neck:  Normal range of motion. Neck supple.  Cardiovascular: Normal rate and regular rhythm.  Exam reveals no gallop and no friction rub.   No murmur heard. Pulmonary/Chest: Effort normal and breath sounds normal. No respiratory distress. She has no wheezes.  Abdominal: Soft. Bowel sounds are normal. She exhibits no distension. There is no tenderness.  Musculoskeletal: Normal range of motion.  Neurological: She is alert and oriented to person, place, and time.  Skin: Skin is warm and dry. She is not diaphoretic.  Nursing note and vitals reviewed.    ED Treatments / Results  Labs (all labs ordered are listed, but only abnormal results are displayed) Labs Reviewed  COMPREHENSIVE METABOLIC PANEL - Abnormal; Notable for the following:       Result Value   Potassium 3.4 (*)    BUN <5 (*)    All other components within normal limits  I-STAT BETA HCG BLOOD, ED ([facility], [facility], [facility] ONLY) - Abnormal; Notable for the following:    I-stat hCG, quantitative >2,000.0 (*)    All other components within normal limits  LIPASE, BLOOD  CBC  URINALYSIS, ROUTINE W REFLEX MICROSCOPIC  I-STAT CG4 LACTIC ACID, ED  I-STAT CG4 LACTIC ACID, ED  SAMPLE TO BLOOD BANK    EKG  EKG Interpretation None       Radiology No results found.  Procedures Procedures (including critical care time)  Medications Ordered in ED Medications  sodium chloride 0.9 % bolus 1,000 mL (not administered)  sodium chloride 0.9 % bolus 1,000 mL (not administered)  ondansetron (ZOFRAN) injection 4 mg (not administered)     Initial Impression / Assessment and Plan / ED Course  I have reviewed the triage vital signs and the nursing notes.  Pertinent labs & imaging results that were available during my care of the patient were reviewed by me and considered in my medical decision making (see chart for details).  Clinical Course     Patient presents with complaints of nausea, vomiting, diarrhea for the past 2 days. She is currently  [redacted] weeks pregnant. Her symptoms are most consistent with a viral gastroenteritis. Her laboratory studies are unremarkable and urinalysis is clear. She was given fluids and medications and appears to be feeling better. At this point, I believe she is appropriate for discharge. She will be given a small quantity of Zofran she can take as needed. She is to return as needed if symptoms worsen.  Final Clinical Impressions(s) / ED Diagnoses   Final diagnoses:  None    New Prescriptions New Prescriptions   No medications on file     Geoffery Lyonsouglas Ramel Tobon, MD 03/04/16 2126

## 2016-03-04 NOTE — Discharge Instructions (Signed)
Clear liquid diet for the next 12 hours, then slowly advance to normal.  Zofran as prescribed as needed for nausea.  Return to the emergency department if you develop severe abdominal pain, bloody stools, high fevers, or other new and concerning symptoms.

## 2016-03-04 NOTE — ED Notes (Signed)
ER MD made aware pt c/o generalized pain orders received and carried out

## 2016-03-04 NOTE — ED Triage Notes (Addendum)
Pt reports cough, body aches, fever, n/v x 3 days. Has not been able to keep anything down. Is a x 4. Also is [redacted] weeks pregnant.

## 2016-03-05 LAB — SAMPLE TO BLOOD BANK

## 2016-04-04 ENCOUNTER — Encounter: Payer: Self-pay | Admitting: Emergency Medicine

## 2016-04-04 ENCOUNTER — Emergency Department: Payer: Medicaid Other

## 2016-04-04 DIAGNOSIS — O209 Hemorrhage in early pregnancy, unspecified: Secondary | ICD-10-CM | POA: Diagnosis not present

## 2016-04-04 DIAGNOSIS — O9A212 Injury, poisoning and certain other consequences of external causes complicating pregnancy, second trimester: Secondary | ICD-10-CM | POA: Diagnosis not present

## 2016-04-04 DIAGNOSIS — Z79899 Other long term (current) drug therapy: Secondary | ICD-10-CM | POA: Insufficient documentation

## 2016-04-04 DIAGNOSIS — J45909 Unspecified asthma, uncomplicated: Secondary | ICD-10-CM | POA: Diagnosis not present

## 2016-04-04 DIAGNOSIS — Y999 Unspecified external cause status: Secondary | ICD-10-CM | POA: Insufficient documentation

## 2016-04-04 DIAGNOSIS — Z5321 Procedure and treatment not carried out due to patient leaving prior to being seen by health care provider: Secondary | ICD-10-CM | POA: Insufficient documentation

## 2016-04-04 DIAGNOSIS — Z3A18 18 weeks gestation of pregnancy: Secondary | ICD-10-CM | POA: Diagnosis not present

## 2016-04-04 DIAGNOSIS — Y9389 Activity, other specified: Secondary | ICD-10-CM | POA: Insufficient documentation

## 2016-04-04 DIAGNOSIS — Y92410 Unspecified street and highway as the place of occurrence of the external cause: Secondary | ICD-10-CM | POA: Insufficient documentation

## 2016-04-04 LAB — CBC WITH DIFFERENTIAL/PLATELET
BASOS ABS: 0.1 10*3/uL (ref 0–0.1)
BASOS PCT: 1 %
Eosinophils Absolute: 0.2 10*3/uL (ref 0–0.7)
Eosinophils Relative: 1 %
HEMATOCRIT: 34.9 % — AB (ref 35.0–47.0)
HEMOGLOBIN: 11.9 g/dL — AB (ref 12.0–16.0)
Lymphocytes Relative: 23 %
Lymphs Abs: 3.1 10*3/uL (ref 1.0–3.6)
MCH: 28 pg (ref 26.0–34.0)
MCHC: 34.1 g/dL (ref 32.0–36.0)
MCV: 82 fL (ref 80.0–100.0)
MONO ABS: 0.6 10*3/uL (ref 0.2–0.9)
Monocytes Relative: 4 %
NEUTROS ABS: 9.4 10*3/uL — AB (ref 1.4–6.5)
NEUTROS PCT: 71 %
Platelets: 313 10*3/uL (ref 150–440)
RBC: 4.25 MIL/uL (ref 3.80–5.20)
RDW: 13.4 % (ref 11.5–14.5)
WBC: 13.2 10*3/uL — AB (ref 3.6–11.0)

## 2016-04-04 LAB — HCG, QUANTITATIVE, PREGNANCY: HCG, BETA CHAIN, QUANT, S: 23316 m[IU]/mL — AB (ref <5)

## 2016-04-04 LAB — ABO/RH: ABO/RH(D): O POS

## 2016-04-04 NOTE — ED Notes (Signed)
Called for pt in lobby to let her know about the order for CT; pt not present in lobby; called cell phone listed and pt says she's out in the parking lot; pt will come inside and wait for scan

## 2016-04-04 NOTE — ED Notes (Signed)
Pt back to lobby from [place]
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
## 2016-04-05 ENCOUNTER — Emergency Department
Admission: EM | Admit: 2016-04-05 | Discharge: 2016-04-05 | Disposition: A | Discharge status: Home / Self Care | Payer: Medicaid Other | Attending: Emergency Medicine | Admitting: Emergency Medicine

## 2016-04-05 HISTORY — DX: Dizziness and giddiness: R42

## 2016-04-06 ENCOUNTER — Telehealth: Payer: Self-pay | Admitting: Emergency Medicine

## 2016-04-06 NOTE — Telephone Encounter (Signed)
Called patient due to lwot to inquire about condition and follow up plans. Left message.   

## 2016-04-16 DIAGNOSIS — Z8759 Personal history of other complications of pregnancy, childbirth and the puerperium: Secondary | ICD-10-CM

## 2016-04-16 DIAGNOSIS — Z87440 Personal history of urinary (tract) infections: Secondary | ICD-10-CM

## 2016-04-16 HISTORY — DX: Personal history of other complications of pregnancy, childbirth and the puerperium: Z87.59

## 2016-04-16 HISTORY — DX: Personal history of urinary (tract) infections: Z87.440

## 2016-05-08 ENCOUNTER — Encounter (HOSPITAL_COMMUNITY): Payer: Self-pay | Admitting: *Deleted

## 2016-05-08 ENCOUNTER — Inpatient Hospital Stay (HOSPITAL_COMMUNITY)
Admission: AD | Admit: 2016-05-08 | Discharge: 2016-05-11 | Disposition: A | Discharge status: Home / Self Care | Payer: Medicaid Other | Source: Ambulatory Visit | Attending: Obstetrics and Gynecology | Admitting: Obstetrics and Gynecology

## 2016-05-08 DIAGNOSIS — B9620 Unspecified Escherichia coli [E. coli] as the cause of diseases classified elsewhere: Secondary | ICD-10-CM | POA: Insufficient documentation

## 2016-05-08 DIAGNOSIS — N12 Tubulo-interstitial nephritis, not specified as acute or chronic: Secondary | ICD-10-CM

## 2016-05-08 DIAGNOSIS — O9989 Other specified diseases and conditions complicating pregnancy, childbirth and the puerperium: Secondary | ICD-10-CM | POA: Diagnosis not present

## 2016-05-08 DIAGNOSIS — O2302 Infections of kidney in pregnancy, second trimester: Secondary | ICD-10-CM | POA: Diagnosis not present

## 2016-05-08 DIAGNOSIS — Z3A24 24 weeks gestation of pregnancy: Secondary | ICD-10-CM | POA: Insufficient documentation

## 2016-05-08 DIAGNOSIS — Z3A23 23 weeks gestation of pregnancy: Secondary | ICD-10-CM | POA: Diagnosis not present

## 2016-05-08 DIAGNOSIS — M549 Dorsalgia, unspecified: Secondary | ICD-10-CM | POA: Diagnosis present

## 2016-05-08 LAB — URINALYSIS, ROUTINE W REFLEX MICROSCOPIC
Bilirubin Urine: NEGATIVE
Glucose, UA: NEGATIVE mg/dL
Ketones, ur: NEGATIVE mg/dL
NITRITE: POSITIVE — AB
PH: 7 (ref 5.0–8.0)
Protein, ur: 100 mg/dL — AB
SPECIFIC GRAVITY, URINE: 1.015 (ref 1.005–1.030)

## 2016-05-08 LAB — COMPREHENSIVE METABOLIC PANEL
ALT: 23 U/L (ref 14–54)
AST: 16 U/L (ref 15–41)
Albumin: 3.2 g/dL — ABNORMAL LOW (ref 3.5–5.0)
Alkaline Phosphatase: 97 U/L (ref 38–126)
Anion gap: 6 (ref 5–15)
BUN: 7 mg/dL (ref 6–20)
CHLORIDE: 105 mmol/L (ref 101–111)
CO2: 26 mmol/L (ref 22–32)
CREATININE: 0.43 mg/dL — AB (ref 0.44–1.00)
Calcium: 8.7 mg/dL — ABNORMAL LOW (ref 8.9–10.3)
Glucose, Bld: 97 mg/dL (ref 65–99)
Potassium: 4.3 mmol/L (ref 3.5–5.1)
Sodium: 137 mmol/L (ref 135–145)
Total Bilirubin: 0.3 mg/dL (ref 0.3–1.2)
Total Protein: 6.5 g/dL (ref 6.5–8.1)

## 2016-05-08 LAB — CBC WITH DIFFERENTIAL/PLATELET
Basophils Absolute: 0 10*3/uL (ref 0.0–0.1)
Basophils Relative: 0 %
EOS PCT: 1 %
Eosinophils Absolute: 0.1 10*3/uL (ref 0.0–0.7)
HCT: 31.6 % — ABNORMAL LOW (ref 36.0–46.0)
Hemoglobin: 11 g/dL — ABNORMAL LOW (ref 12.0–15.0)
LYMPHS ABS: 3.1 10*3/uL (ref 0.7–4.0)
LYMPHS PCT: 23 %
MCH: 27.9 pg (ref 26.0–34.0)
MCHC: 34.8 g/dL (ref 30.0–36.0)
MCV: 80.2 fL (ref 78.0–100.0)
MONO ABS: 0.3 10*3/uL (ref 0.1–1.0)
MONOS PCT: 2 %
Neutro Abs: 9.8 10*3/uL — ABNORMAL HIGH (ref 1.7–7.7)
Neutrophils Relative %: 74 %
PLATELETS: 332 10*3/uL (ref 150–400)
RBC: 3.94 MIL/uL (ref 3.87–5.11)
RDW: 13.3 % (ref 11.5–15.5)
WBC: 13.2 10*3/uL — AB (ref 4.0–10.5)

## 2016-05-08 LAB — LIPASE, BLOOD: LIPASE: 19 U/L (ref 11–51)

## 2016-05-08 MED ORDER — MORPHINE SULFATE (PF) 4 MG/ML IV SOLN
2.0000 mg | INTRAVENOUS | Status: DC | PRN
  Administered 2016-05-08 – 2016-05-10 (×3): 2 mg via INTRAVENOUS
  Filled 2016-05-08 (×3): qty 1

## 2016-05-08 MED ORDER — CALCIUM CARBONATE ANTACID 500 MG PO CHEW: 2.0000 | CHEWABLE_TABLET | ORAL | Status: DC | PRN

## 2016-05-08 MED ORDER — CEFTRIAXONE SODIUM 2 G IJ SOLR
2.0000 g | INTRAMUSCULAR | Status: DC
  Administered 2016-05-08 – 2016-05-10 (×3): 2 g via INTRAVENOUS
  Filled 2016-05-08 (×4): qty 2

## 2016-05-08 MED ORDER — PROMETHAZINE HCL 25 MG/ML IJ SOLN
25.0000 mg | Freq: Four times a day (QID) | INTRAMUSCULAR | Status: DC | PRN
  Administered 2016-05-08 – 2016-05-09 (×2): 25 mg via INTRAVENOUS
  Filled 2016-05-08 (×2): qty 1

## 2016-05-08 MED ORDER — SODIUM CHLORIDE 0.9 % IV SOLN
INTRAVENOUS | Status: DC
  Administered 2016-05-08 – 2016-05-10 (×4): via INTRAVENOUS

## 2016-05-08 MED ORDER — ONDANSETRON 4 MG PO TBDP: 4.0000 mg | ORAL_TABLET | Freq: Three times a day (TID) | ORAL | Status: DC | PRN

## 2016-05-08 MED ORDER — LACTATED RINGERS IV SOLN: INTRAVENOUS | Status: DC

## 2016-05-08 MED ORDER — ACETAMINOPHEN 325 MG PO TABS
650.0000 mg | ORAL_TABLET | ORAL | Status: DC | PRN
  Administered 2016-05-09 – 2016-05-10 (×2): 650 mg via ORAL
  Filled 2016-05-08 (×2): qty 2

## 2016-05-08 NOTE — MAU Note (Signed)
For past 2 wks I have had a fever and peeing a lot with bad smell to urine. Hurts when I pee. Had had n/v the entire pregnancy. Last 3 wks when I throw up it is nothing but bile. No energy. Can't sleep because back hurts and today have had stabbing pain mid stomach. Back pain is bil flank area but sometimes moves from side to side. Tylenol not helping

## 2016-05-08 NOTE — MAU Provider Note (Signed)
History     CSN: [id number]  Arrival date and time: 05/08/16 2003   First Provider Initiated Contact with Patient 05/08/16 2036      Chief Complaint  Patient presents with  . Back Pain  . Dysuria   G3P1011 @23 .5 wks here with back pain and dysuria. She reports sx started 2 weeks ago. She describes back pain as constant, lower, bilateral, and no response to Tylenol. Lying down and standing makes the pain worse. Rates pain 10/10. She also reports dysuria x1 week. She describes as pain with intense pressure with urination. She denies hematuria but reports urinary frequency and urine malodor. She also reports fevers of 100.0 for the last few days. She c/o frontal HA x2 weeks. She has used Tylenol for the back pain and HA and had no relief. She c/o N/V throughout the pregnancy with worsening over the last 2 weeks reporting emesis 3-4 times daily. She was Rx'd Zofran but states med was not at the pharmacy. Reports good FM. Denies VB, LOF, and ctx.    OB History    Gravida Para Term Preterm AB Living   3 1 1   1 1    SAB TAB Ectopic Multiple Live Births   1       1      Past Medical History:  Diagnosis Date  . Anemia   . Asthma   . No pertinent past medical history   . Vertigo     Past Surgical History:  Procedure Laterality Date  . CESAREAN SECTION N/A 02/10/2013   Procedure: CESAREAN SECTION;  Surgeon: Levi AlandMark E Anderson, MD;  Location: [facility];  Service: Obstetrics;  Laterality: N/A;  . DILATION AND EVACUATION  04/10/2012   Procedure: DILATATION AND EVACUATION;  Surgeon: Mickel Baasichard D Kaplan, MD;  Location: [facility];  Service: Gynecology;  Laterality: N/A;  . DILATION AND EVACUATION N/A 06/29/2012   Procedure: DILATATION AND EVACUATION;  Surgeon: Mickel Baasichard D Kaplan, MD;  Location: [facility];  Service: Gynecology;  Laterality: N/A;  . NO PAST SURGERIES      Family History  Problem Relation Age of Onset  . Diabetes Father   . Diabetes Sister   . Other Neg Hx     Social History  Substance  Use Topics  . Smoking status: Never Smoker  . Smokeless tobacco: Never Used  . Alcohol use No    Allergies: No Known Allergies  Prescriptions Prior to Admission  Medication Sig Dispense Refill Last Dose  . cyclobenzaprine (FLEXERIL) 10 MG tablet Take 1 tablet (10 mg total) by mouth 2 (two) times daily as needed for muscle spasms. 20 tablet 0   . meclizine (ANTIVERT) 50 MG tablet Take 0.5-1 tablets (25-50 mg total) by mouth 3 (three) times daily as needed for dizziness or nausea. (Patient not taking: Reported on 12/11/2015) 30 tablet 0 Completed Course at Unknown time  . naproxen (NAPROSYN) 375 MG tablet Take 1 tablet (375 mg total) by mouth 2 (two) times daily. (Patient not taking: Reported on 12/11/2015) 20 tablet 0 Completed Course at Unknown time  . naproxen (NAPROSYN) 500 MG tablet Take 1 tablet (500 mg total) by mouth 2 (two) times daily. 30 tablet 0   . ondansetron (ZOFRAN ODT) 8 MG disintegrating tablet 8mg  ODT q4 hours prn nausea 4 tablet 0   . oseltamivir (TAMIFLU) 75 MG capsule Take 1 capsule (75 mg total) by mouth every 12 (twelve) hours. (Patient not taking: Reported on 12/11/2015) 10 capsule 0 Completed Course at Unknown time  .  promethazine (PHENERGAN) 25 MG tablet Take 1 tablet (25 mg total) by mouth every 6 (six) hours as needed for refractory nausea / vomiting. (Patient not taking: Reported on 12/11/2015) 10 tablet 0 Completed Course at Unknown time    Review of Systems  Constitutional: Negative for chills and fever.  Gastrointestinal: Positive for nausea and vomiting (3-4 times per day). Negative for constipation.  Genitourinary: Positive for dysuria, frequency and urgency. Negative for hematuria, vaginal bleeding and vaginal discharge.  Neurological: Positive for headaches.   Physical Exam   Blood pressure (!) 136/52, pulse 96, temperature 98.7 F (37.1 C), resp. rate 18, height 5\' 4"  (1.626 m), weight 89.9 kg (198 lb 3.2 oz), last menstrual period 12/07/2015, unknown if  currently breastfeeding.  Physical Exam  Nursing note and vitals reviewed. Constitutional: She is oriented to person, place, and time. She appears well-developed and well-nourished. No distress.  HENT:  Head: Normocephalic and atraumatic.  Neck: Normal range of motion.  Cardiovascular: Normal rate.   Respiratory: Effort normal.  GI: Soft. She exhibits no distension. There is no tenderness. There is CVA tenderness (L>R).  gravid  Musculoskeletal: Normal range of motion.       Thoracic back: She exhibits tenderness (L>R).  Neurological: She is alert and oriented to person, place, and time.  Skin: Skin is warm and dry.  Psychiatric: She has a normal mood and affect.   EFM: 150 bpm, mod variability, + accels, no decels Toco: none  Results for orders placed or performed during the hospital encounter of 05/08/16 (from the past 24 hour(s))  Urinalysis, Routine w reflex microscopic     Status: Abnormal   Collection Time: 05/08/16  8:20 PM  Result Value Ref Range   Color, Urine YELLOW YELLOW   APPearance CLOUDY (A) CLEAR   Specific Gravity, Urine 1.015 1.005 - 1.030   pH 7.0 5.0 - 8.0   Glucose, UA NEGATIVE NEGATIVE mg/dL   Hgb urine dipstick SMALL (A) NEGATIVE   Bilirubin Urine NEGATIVE NEGATIVE   Ketones, ur NEGATIVE NEGATIVE mg/dL   Protein, ur 696 (A) NEGATIVE mg/dL   Nitrite POSITIVE (A) NEGATIVE   Leukocytes, UA LARGE (A) NEGATIVE   RBC / HPF 6-30 0 - 5 RBC/hpf   WBC, UA TOO NUMEROUS TO COUNT 0 - 5 WBC/hpf   Bacteria, UA MANY (A) NONE SEEN   Squamous Epithelial / LPF 0-5 (A) NONE SEEN   Mucous PRESENT   CBC with Differential/Platelet     Status: Abnormal   Collection Time: 05/08/16  8:51 PM  Result Value Ref Range   WBC 13.2 (H) 4.0 - 10.5 K/uL   RBC 3.94 3.87 - 5.11 MIL/uL   Hemoglobin 11.0 (L) 12.0 - 15.0 g/dL   HCT 29.5 (L) 28.4 - 13.2 %   MCV 80.2 78.0 - 100.0 fL   MCH 27.9 26.0 - 34.0 pg   MCHC 34.8 30.0 - 36.0 g/dL   RDW 44.0 10.2 - 72.5 %   Platelets 332 150 -  400 K/uL   Neutrophils Relative % 74 %   Neutro Abs 9.8 (H) 1.7 - 7.7 K/uL   Lymphocytes Relative 23 %   Lymphs Abs 3.1 0.7 - 4.0 K/uL   Monocytes Relative 2 %   Monocytes Absolute 0.3 0.1 - 1.0 K/uL   Eosinophils Relative 1 %   Eosinophils Absolute 0.1 0.0 - 0.7 K/uL   Basophils Relative 0 %   Basophils Absolute 0.0 0.0 - 0.1 K/uL  Comprehensive metabolic panel  Status: Abnormal   Collection Time: 05/08/16  8:51 PM  Result Value Ref Range   Sodium 137 135 - 145 mmol/L   Potassium 4.3 3.5 - 5.1 mmol/L   Chloride 105 101 - 111 mmol/L   CO2 26 22 - 32 mmol/L   Glucose, Bld 97 65 - 99 mg/dL   BUN 7 6 - 20 mg/dL   Creatinine, Ser 1.61 (L) 0.44 - 1.00 mg/dL   Calcium 8.7 (L) 8.9 - 10.3 mg/dL   Total Protein 6.5 6.5 - 8.1 g/dL   Albumin 3.2 (L) 3.5 - 5.0 g/dL   AST 16 15 - 41 U/L   ALT 23 14 - 54 U/L   Alkaline Phosphatase 97 38 - 126 U/L   Total Bilirubin 0.3 0.3 - 1.2 mg/dL   GFR calc non Af Amer >60 >60 mL/min   GFR calc Af Amer >60 >60 mL/min   Anion gap 6 5 - 15  Lipase, blood     Status: None   Collection Time: 05/08/16  8:51 PM  Result Value Ref Range   Lipase 19 11 - 51 U/L   MAU Course  Procedures  MDM Labs ordered and reviewed. Presentation, clinical findings, and plan discussed with Dr. Dareen Piano. Plan for admit.  Assessment and Plan  [redacted] weeks gestation Pyelonephritis Admit to Ante Rocephin Analgesics Antiemetics Urine culture Management per Dr. Ronald Pippins, CNM 05/08/2016, 8:37 PM

## 2016-05-09 ENCOUNTER — Encounter (HOSPITAL_COMMUNITY): Payer: Self-pay | Admitting: *Deleted

## 2016-05-09 DIAGNOSIS — N12 Tubulo-interstitial nephritis, not specified as acute or chronic: Secondary | ICD-10-CM

## 2016-05-09 MED ORDER — OXYCODONE-ACETAMINOPHEN 5-325 MG PO TABS
1.0000 | ORAL_TABLET | ORAL | Status: DC | PRN
  Administered 2016-05-09: 1 via ORAL
  Administered 2016-05-09 – 2016-05-11 (×6): 2 via ORAL
  Filled 2016-05-09 (×6): qty 2
  Filled 2016-05-09: qty 1

## 2016-05-09 NOTE — H&P (Signed)
H&P per ER note.  DX: IUP at 24 weeks        Left pyelonephritris

## 2016-05-09 NOTE — Progress Notes (Signed)
HD#2  Pt states that she feels better. She states flank pain is still 10/10. She is having chills. +FM VSSAF IMP/ Left pyelo Plan/ Await C&S           Continue ABXs

## 2016-05-10 MED ORDER — ONDANSETRON 8 MG PO TBDP
8.0000 mg | ORAL_TABLET | Freq: Three times a day (TID) | ORAL | Status: DC | PRN
  Administered 2016-05-10 – 2016-05-11 (×2): 8 mg via ORAL
  Filled 2016-05-10 (×4): qty 1

## 2016-05-10 NOTE — Progress Notes (Signed)
HD#3   Pt states that she had chills last night. She has no Fever. She also c/o severe back pain however she appears to be comfortable, sitting up in bed. She has minimal CVAT. She also startes that she has had a headache throughout this preg and occ nausea.  The culture has >100k gra, neg rods. Waiting on sens.  PLAN/ Will hep lock IVFs             Zofran prn

## 2016-05-10 NOTE — Progress Notes (Signed)
Patient c/o nausea but is sitting up on side of bed eating an egg omlet with ketchup.

## 2016-05-11 LAB — CULTURE, OB URINE: Culture: >=100000 — AB

## 2016-05-11 MED ORDER — CEPHALEXIN 500 MG PO CAPS: 500.0000 mg | ORAL_CAPSULE | Freq: Four times a day (QID) | ORAL | 0 refills | Status: AC

## 2016-05-11 MED ORDER — OXYCODONE-ACETAMINOPHEN 5-325 MG PO TABS: 1.0000 | ORAL_TABLET | ORAL | 0 refills | Status: DC | PRN

## 2016-05-11 MED ORDER — ONDANSETRON 8 MG PO TBDP: 8.0000 mg | ORAL_TABLET | Freq: Three times a day (TID) | ORAL | 0 refills | Status: DC | PRN

## 2016-05-11 NOTE — Progress Notes (Signed)
Discharge instructions reviewed with patient. Patient receptive to discharge instructions.

## 2016-05-11 NOTE — Discharge Summary (Signed)
Pt is a 21 y/o white female G3P1011 at 24 wks who presented to the ER c/o flank pain/headache/nausea/vomiting. She was worked up in the ER and felt to have pyelonephritis. She was admitted and started on Rocephin. Her initial WBC was 13K. She remained afebrile throughout the hospital course. The culture returned with >100K E Coli.  Pt flank pain improved. She was discharged on Keflex and zofran. Will follow up in office in 10 days. Will consider prophlactic abxs for the rest of the preg at that time

## 2016-05-11 NOTE — Progress Notes (Signed)
HD#4 Pt states that she still has pain, however she is sleeping comfortably on my entrance into the room. The culture returned with >100K E coli.  Sens still pending VSSAF IMP/ Resolving pyelo. Plan/ Will discharge to home on Keflex/zofran           Recheck in office in 10 days           Check sens later today.            May consider daily abx rest of preg. This is second UTI this preg

## 2016-06-04 ENCOUNTER — Encounter (HOSPITAL_COMMUNITY): Payer: Self-pay | Admitting: *Deleted

## 2016-06-04 ENCOUNTER — Inpatient Hospital Stay (HOSPITAL_COMMUNITY)
Admission: AD | Admit: 2016-06-04 | Discharge: 2016-06-04 | Disposition: A | Discharge status: Home / Self Care | Payer: Medicaid Other | Source: Ambulatory Visit | Attending: Obstetrics and Gynecology | Admitting: Obstetrics and Gynecology

## 2016-06-04 DIAGNOSIS — R300 Dysuria: Secondary | ICD-10-CM | POA: Diagnosis present

## 2016-06-04 DIAGNOSIS — O26899 Other specified pregnancy related conditions, unspecified trimester: Secondary | ICD-10-CM

## 2016-06-04 DIAGNOSIS — O9989 Other specified diseases and conditions complicating pregnancy, childbirth and the puerperium: Secondary | ICD-10-CM | POA: Diagnosis not present

## 2016-06-04 DIAGNOSIS — Z3A27 27 weeks gestation of pregnancy: Secondary | ICD-10-CM | POA: Diagnosis not present

## 2016-06-04 DIAGNOSIS — R109 Unspecified abdominal pain: Secondary | ICD-10-CM | POA: Diagnosis not present

## 2016-06-04 DIAGNOSIS — O2342 Unspecified infection of urinary tract in pregnancy, second trimester: Secondary | ICD-10-CM

## 2016-06-04 HISTORY — DX: Personal history of other complications of pregnancy, childbirth and the puerperium: Z87.59

## 2016-06-04 HISTORY — DX: Personal history of urinary (tract) infections: Z87.440

## 2016-06-04 HISTORY — DX: Headache: R51

## 2016-06-04 HISTORY — DX: Headache, unspecified: R51.9

## 2016-06-04 LAB — CBC WITH DIFFERENTIAL/PLATELET
Basophils Absolute: 0 K/uL (ref 0.0–0.1)
Basophils Relative: 0 %
Eosinophils Absolute: 0.2 K/uL (ref 0.0–0.7)
Eosinophils Relative: 2 %
HCT: 31.4 % — ABNORMAL LOW (ref 36.0–46.0)
Hemoglobin: 10.6 g/dL — ABNORMAL LOW (ref 12.0–15.0)
Lymphocytes Relative: 22 %
Lymphs Abs: 2.6 K/uL (ref 0.7–4.0)
MCH: 27.4 pg (ref 26.0–34.0)
MCHC: 33.8 g/dL (ref 30.0–36.0)
MCV: 81.1 fL (ref 78.0–100.0)
Monocytes Absolute: 0.4 K/uL (ref 0.1–1.0)
Monocytes Relative: 4 %
Neutro Abs: 8.3 K/uL — ABNORMAL HIGH (ref 1.7–7.7)
Neutrophils Relative %: 72 %
Platelets: 275 K/uL (ref 150–400)
RBC: 3.87 MIL/uL (ref 3.87–5.11)
RDW: 13.4 % (ref 11.5–15.5)
WBC: 11.6 K/uL — ABNORMAL HIGH (ref 4.0–10.5)

## 2016-06-04 LAB — URINALYSIS, MICROSCOPIC (REFLEX)

## 2016-06-04 LAB — URINALYSIS, ROUTINE W REFLEX MICROSCOPIC
BILIRUBIN URINE: NEGATIVE
GLUCOSE, UA: NEGATIVE mg/dL
KETONES UR: 15 mg/dL — AB
Nitrite: NEGATIVE
PROTEIN: 100 mg/dL — AB
Specific Gravity, Urine: 1.025 (ref 1.005–1.030)
pH: 7 (ref 5.0–8.0)

## 2016-06-04 MED ORDER — PROMETHAZINE HCL 25 MG/ML IJ SOLN: 12.5000 mg | Freq: Once | INTRAMUSCULAR | Status: DC

## 2016-06-04 MED ORDER — ONDANSETRON 8 MG PO TBDP
8.0000 mg | ORAL_TABLET | Freq: Once | ORAL | Status: AC
  Administered 2016-06-04: 8 mg via ORAL
  Filled 2016-06-04: qty 1

## 2016-06-04 MED ORDER — PROMETHAZINE HCL 25 MG PO TABS: 25.0000 mg | ORAL_TABLET | Freq: Four times a day (QID) | ORAL | 0 refills | Status: DC | PRN

## 2016-06-04 MED ORDER — HYDROMORPHONE HCL 1 MG/ML IJ SOLN: 1.0000 mg | Freq: Once | INTRAMUSCULAR | Status: DC

## 2016-06-04 MED ORDER — HYDROCODONE-ACETAMINOPHEN 5-325 MG PO TABS: 1.0000 | ORAL_TABLET | ORAL | 0 refills | Status: DC | PRN

## 2016-06-04 MED ORDER — SODIUM CHLORIDE 0.9 % IV BOLUS (SEPSIS): 500.0000 mL | Freq: Once | INTRAVENOUS | Status: DC

## 2016-06-04 MED ORDER — HYDROMORPHONE HCL 1 MG/ML IJ SOLN
1.0000 mg | Freq: Once | INTRAMUSCULAR | Status: AC
  Administered 2016-06-04: 1 mg via INTRAMUSCULAR
  Filled 2016-06-04: qty 1

## 2016-06-04 MED ORDER — CEPHALEXIN 500 MG PO CAPS: 500.0000 mg | ORAL_CAPSULE | Freq: Four times a day (QID) | ORAL | 0 refills | Status: DC

## 2016-06-04 NOTE — MAU Note (Signed)
Pt presents to MAU with complaints of back pain, pain and pressure with urination. Frequent urination Pt states she was hospitalized in February for IV antibiotics for kidney infection.

## 2016-06-04 NOTE — MAU Provider Note (Signed)
History     CSN: [id number]  Arrival date and time: 06/04/16 1607   First Provider Initiated Contact with Patient 06/04/16 1655     Chief Complaint  Patient presents with  . Flank Pain  . Urinary Urgency   HPI Ruth Gross is a 21 y.o. G3P1011 at [redacted]w[redacted]d who presents with flank pain & dysuria. Was admitted for pyelonephritis 2/23-2/26. Was discharged on oral antibiotics which she finished 2 weeks ago. Has not been seen in office since discharge. Symptoms returned 4 days ago. Initially had flank pain that alternates sides. Since last night has had dysuria, urinary urgency, and nausea. Denies fever/chills, vomiting, vaginal bleeding, hematuria, abdominal pain, or LOF. Positive fetal movement. States flank pain is constant sharp/throbbing pain that she rates 10/10. Has taken percocet for it with moderate relief, but states she doesn't like "how the medicine makes her feel".   OB History    Gravida Para Term Preterm AB Living   3 1 1   1 1    SAB TAB Ectopic Multiple Live Births   1       1      Past Medical History:  Diagnosis Date  . Anemia   . Asthma   . Chronic kidney disease   . Headache   . Hx of pyelonephritis during pregnancy 04/2016  . Vertigo     Past Surgical History:  Procedure Laterality Date  . CESAREAN SECTION N/A 02/10/2013   Procedure: CESAREAN SECTION;  Surgeon: Levi Aland, MD;  Location: [facility];  Service: Obstetrics;  Laterality: N/A;  . DILATION AND EVACUATION  04/10/2012   Procedure: DILATATION AND EVACUATION;  Surgeon: Mickel Baas, MD;  Location: [facility];  Service: Gynecology;  Laterality: N/A;  . DILATION AND EVACUATION N/A 06/29/2012   Procedure: DILATATION AND EVACUATION;  Surgeon: Mickel Baas, MD;  Location: [facility];  Service: Gynecology;  Laterality: N/A;  . NO PAST SURGERIES      Family History  Problem Relation Age of Onset  . Diabetes Father   . Diabetes Sister   . Other Neg Hx     Social History  Substance Use Topics  .  Smoking status: Never Smoker  . Smokeless tobacco: Never Used  . Alcohol use No    Allergies: No Known Allergies  Prescriptions Prior to Admission  Medication Sig Dispense Refill Last Dose  . ondansetron (ZOFRAN-ODT) 8 MG disintegrating tablet Take 1 tablet (8 mg total) by mouth every 8 (eight) hours as needed for nausea or vomiting. 20 tablet 0   . oxyCODONE-acetaminophen (PERCOCET/ROXICET) 5-325 MG tablet Take 1-2 tablets by mouth every 4 (four) hours as needed for moderate pain. 30 tablet 0   . Prenatal MV-Min-FA-Omega-3 (PRENATAL GUMMIES/DHA & FA) 0.4-32.5 MG CHEW Chew 2 each by mouth daily.   05/08/2016 at Unknown time    Review of Systems  Constitutional: Negative for chills and fever.  Gastrointestinal: Positive for nausea. Negative for abdominal pain, constipation, diarrhea and vomiting.  Genitourinary: Positive for dysuria, flank pain, frequency and urgency. Negative for hematuria, vaginal bleeding and vaginal discharge.  Musculoskeletal: Positive for back pain.   Physical Exam   Blood pressure 112/67, pulse (!) 108, temperature 99.4 F (37.4 C), resp. rate 18, height 5\' 4"  (1.626 m), weight 198 lb (89.8 kg), last menstrual period 12/07/2015, unknown if currently breastfeeding.  Physical Exam  Nursing note and vitals reviewed. Constitutional: She is oriented to person, place, and time. She appears well-developed and well-nourished. No distress.  HENT:  Head: Normocephalic and atraumatic.  Eyes: Conjunctivae are normal. Right eye exhibits no discharge. Left eye exhibits no discharge. No scleral icterus.  Neck: Normal range of motion.  Cardiovascular: Normal rate, regular rhythm and normal heart sounds.   No murmur heard. Respiratory: Effort normal and breath sounds normal. No respiratory distress. She has no wheezes.  GI: Soft. Bowel sounds are normal. There is CVA tenderness (left).  Neurological: She is alert and oriented to person, place, and time.  Skin: Skin is warm  and dry. She is not diaphoretic.  Psychiatric: She has a normal mood and affect. Her behavior is normal. Judgment and thought content normal.   Fetal Tracing:  Baseline: 140 Variability: moderate Accelerations: 15x15 Decelerations: none  Toco: none MAU Course  Procedures Results for orders placed or performed during the hospital encounter of 06/04/16 (from the past 24 hour(s))  Urinalysis, Routine w reflex microscopic     Status: Abnormal   Collection Time: 06/04/16  4:08 PM  Result Value Ref Range   Color, Urine YELLOW YELLOW   APPearance CLEAR CLEAR   Specific Gravity, Urine 1.025 1.005 - 1.030   pH 7.0 5.0 - 8.0   Glucose, UA NEGATIVE NEGATIVE mg/dL   Hgb urine dipstick LARGE (A) NEGATIVE   Bilirubin Urine NEGATIVE NEGATIVE   Ketones, ur 15 (A) NEGATIVE mg/dL   Protein, ur 161100 (A) NEGATIVE mg/dL   Nitrite NEGATIVE NEGATIVE   Leukocytes, UA SMALL (A) NEGATIVE  Urinalysis, Microscopic (reflex)     Status: Abnormal   Collection Time: 06/04/16  4:08 PM  Result Value Ref Range   RBC / HPF 6-30 0 - 5 RBC/hpf   WBC, UA 0-5 0 - 5 WBC/hpf   Bacteria, UA FEW (A) NONE SEEN   Squamous Epithelial / LPF 0-5 (A) NONE SEEN   Mucous PRESENT   CBC with Differential/Platelet     Status: Abnormal   Collection Time: 06/04/16  5:41 PM  Result Value Ref Range   WBC 11.6 (H) 4.0 - 10.5 K/uL   RBC 3.87 3.87 - 5.11 MIL/uL   Hemoglobin 10.6 (L) 12.0 - 15.0 g/dL   HCT 09.631.4 (L) 04.536.0 - 40.946.0 %   MCV 81.1 78.0 - 100.0 fL   MCH 27.4 26.0 - 34.0 pg   MCHC 33.8 30.0 - 36.0 g/dL   RDW 81.113.4 91.411.5 - 78.215.5 %   Platelets 275 150 - 400 K/uL   Neutrophils Relative % 72 %   Neutro Abs 8.3 (H) 1.7 - 7.7 K/uL   Lymphocytes Relative 22 %   Lymphs Abs 2.6 0.7 - 4.0 K/uL   Monocytes Relative 4 %   Monocytes Absolute 0.4 0.1 - 1.0 K/uL   Eosinophils Relative 2 %   Eosinophils Absolute 0.2 0.0 - 0.7 K/uL   Basophils Relative 0 %   Basophils Absolute 0.0 0.0 - 0.1 K/uL    MDM Category 1 tracing, no  contractions CBC U/a -- hematuria & small leuks -- will send for urine culture VSS, afebrile Zofran & IM dilaudid S/w Dr. Tenny Crawoss. Will send home on antibiotics. Rx vicodin instead of percocet. F/u in office within a week. Send urine for culture. Return for worsening symptoms.   Assessment and Plan  A: 1. Urinary tract infection in mother during second trimester of pregnancy   2. Pregnancy with flank pain, antepartum    P: Discharge home Rx vicodin, phenergan, keflex Discussed reasons to return to MAU Keep follow up appointment with OB/PCP --- pt has appt scheduled for Monday  Urine culture pending   Judeth Horn 06/04/2016, 4:54 PM

## 2016-06-04 NOTE — Discharge Instructions (Signed)

## 2016-06-06 LAB — CULTURE, OB URINE

## 2016-07-01 ENCOUNTER — Other Ambulatory Visit: Payer: Self-pay | Admitting: Obstetrics & Gynecology

## 2016-07-29 ENCOUNTER — Inpatient Hospital Stay (HOSPITAL_COMMUNITY)
Admission: AD | Admit: 2016-07-29 | Discharge: 2016-07-30 | Disposition: A | Discharge status: Home / Self Care | Payer: Medicaid Other | Source: Ambulatory Visit | Attending: Obstetrics and Gynecology | Admitting: Obstetrics and Gynecology

## 2016-07-29 ENCOUNTER — Encounter (HOSPITAL_COMMUNITY): Payer: Self-pay

## 2016-07-29 DIAGNOSIS — O26833 Pregnancy related renal disease, third trimester: Secondary | ICD-10-CM | POA: Insufficient documentation

## 2016-07-29 DIAGNOSIS — O9989 Other specified diseases and conditions complicating pregnancy, childbirth and the puerperium: Secondary | ICD-10-CM | POA: Diagnosis not present

## 2016-07-29 DIAGNOSIS — Z9889 Other specified postprocedural states: Secondary | ICD-10-CM | POA: Diagnosis not present

## 2016-07-29 DIAGNOSIS — R51 Headache: Secondary | ICD-10-CM | POA: Diagnosis present

## 2016-07-29 DIAGNOSIS — I129 Hypertensive chronic kidney disease with stage 1 through stage 4 chronic kidney disease, or unspecified chronic kidney disease: Secondary | ICD-10-CM | POA: Insufficient documentation

## 2016-07-29 DIAGNOSIS — Z3A35 35 weeks gestation of pregnancy: Secondary | ICD-10-CM | POA: Diagnosis not present

## 2016-07-29 DIAGNOSIS — R519 Headache, unspecified: Secondary | ICD-10-CM

## 2016-07-29 DIAGNOSIS — N189 Chronic kidney disease, unspecified: Secondary | ICD-10-CM | POA: Insufficient documentation

## 2016-07-29 DIAGNOSIS — O26893 Other specified pregnancy related conditions, third trimester: Secondary | ICD-10-CM | POA: Diagnosis present

## 2016-07-29 DIAGNOSIS — Z79899 Other long term (current) drug therapy: Secondary | ICD-10-CM | POA: Diagnosis not present

## 2016-07-29 NOTE — MAU Note (Signed)
Patient presents with elevated pressures. Patient states that she has a headache, chest pain and dizziness that started today. Fetus active. Patient denies any bleeding or LOF.

## 2016-07-29 NOTE — MAU Provider Note (Signed)
History     CSN: [id number]  Arrival date and time: 07/29/16 2335   First Provider Initiated Contact with Patient 07/29/16 2355      Chief Complaint  Patient presents with  . Hypertension   Ruth Gross is a 21 y.o. G3P1011 at [redacted]w[redacted]d who presents today with high blood pressure. She states that she woke up this morning and felt "different". She states that she has felt tired, had a headache, had an episode of incontinence as well. She rates her headache 5/10 at this time. She has not taken anything for it today. She denies any complications with her last pregnancy. She denies any complications with this pregnancy at this time. She states that she has taken her blood pressure at home multiple times today and it has been anywhere from 140-170/90-116. She has been using an electronic wrist and arm cuff. She states that they are her mom's blood pressure cuffs. Next appt in the office on 08/04/16.   Hypertension  This is a new problem. The current episode started today. The problem is unchanged. Associated symptoms include anxiety and headaches. Associated agents: pregnancy      Past Medical History:  Diagnosis Date  . Anemia   . Asthma   . Chronic kidney disease   . Headache   . Hx of pyelonephritis during pregnancy 04/2016  . Vertigo     Past Surgical History:  Procedure Laterality Date  . CESAREAN SECTION N/A 02/10/2013   Procedure: CESAREAN SECTION;  Surgeon: Levi Aland, MD;  Location: [facility];  Service: Obstetrics;  Laterality: N/A;  . DILATION AND EVACUATION  04/10/2012   Procedure: DILATATION AND EVACUATION;  Surgeon: Mickel Baas, MD;  Location: [facility];  Service: Gynecology;  Laterality: N/A;  . DILATION AND EVACUATION N/A 06/29/2012   Procedure: DILATATION AND EVACUATION;  Surgeon: Mickel Baas, MD;  Location: [facility];  Service: Gynecology;  Laterality: N/A;    Family History  Problem Relation Age of Onset  . Diabetes Father   . Diabetes Sister   . Other  Neg Hx     Social History  Substance Use Topics  . Smoking status: Never Smoker  . Smokeless tobacco: Never Used  . Alcohol use No    Allergies: No Known Allergies  Prescriptions Prior to Admission  Medication Sig Dispense Refill Last Dose  . cephALEXin (KEFLEX) 500 MG capsule Take 1 capsule (500 mg total) by mouth 4 (four) times daily. 40 capsule 0   . HYDROcodone-acetaminophen (NORCO/VICODIN) 5-325 MG tablet Take 1 tablet by mouth every 4 (four) hours as needed. 20 tablet 0   . ondansetron (ZOFRAN-ODT) 8 MG disintegrating tablet Take 1 tablet (8 mg total) by mouth every 8 (eight) hours as needed for nausea or vomiting. 20 tablet 0 06/03/2016 at Unknown time  . Prenatal MV-Min-FA-Omega-3 (PRENATAL GUMMIES/DHA & FA) 0.4-32.5 MG CHEW Chew 2 each by mouth daily.   06/04/2016 at Unknown time  . promethazine (PHENERGAN) 25 MG tablet Take 1 tablet (25 mg total) by mouth every 6 (six) hours as needed for nausea or vomiting. 30 tablet 0     Review of Systems  Constitutional: Negative for chills and fever.  Gastrointestinal: Positive for nausea. Negative for vomiting.       Heartburn   Genitourinary: Negative for vaginal bleeding and vaginal discharge.  Neurological: Positive for headaches.   Physical Exam   Blood pressure 130/84, pulse (!) 106, temperature 98.2 F (36.8 C), temperature source Oral, resp. rate 18, last  menstrual period 12/07/2015, SpO2 99 %, unknown if currently breastfeeding.  Physical Exam  Nursing note and vitals reviewed. Constitutional: She is oriented to person, place, and time. She appears well-developed and well-nourished. No distress.  HENT:  Head: Normocephalic.  Cardiovascular: Normal rate and regular rhythm.   Respiratory: Effort normal and breath sounds normal. No respiratory distress.  GI: Soft. There is no tenderness. There is no rebound.  Neurological: She is alert and oriented to person, place, and time. She has normal reflexes. She exhibits normal  muscle tone (no clonus ).  Skin: Skin is warm and dry.  Psychiatric: She has a normal mood and affect.    Vitals:   07/30/16 0001 07/30/16 0016 07/30/16 0030 07/30/16 0045  BP: 113/76 106/69 120/66 (!) 98/48  Pulse: (!) 101 (!) 113 (!) 110 (!) 106  Resp:      Temp:      TempSrc:      SpO2:   99% 98%   Results for orders placed or performed during the hospital encounter of 07/29/16 (from the past 24 hour(s))  Urinalysis, Routine w reflex microscopic     Status: Abnormal   Collection Time: 07/29/16 11:45 PM  Result Value Ref Range   Color, Urine YELLOW YELLOW   APPearance CLEAR CLEAR   Specific Gravity, Urine 1.013 1.005 - 1.030   pH 6.0 5.0 - 8.0   Glucose, UA 50 (A) NEGATIVE mg/dL   Hgb urine dipstick NEGATIVE NEGATIVE   Bilirubin Urine NEGATIVE NEGATIVE   Ketones, ur NEGATIVE NEGATIVE mg/dL   Protein, ur NEGATIVE NEGATIVE mg/dL   Nitrite NEGATIVE NEGATIVE   Leukocytes, UA NEGATIVE NEGATIVE  Protein / creatinine ratio, urine     Status: None   Collection Time: 07/29/16 11:45 PM  Result Value Ref Range   Creatinine, Urine 92.00 mg/dL   Total Protein, Urine 11 mg/dL   Protein Creatinine Ratio 0.12 0.00 - 0.15 mg/mg[Cre]  CBC     Status: Abnormal   Collection Time: 07/30/16 12:17 AM  Result Value Ref Range   WBC 12.7 (H) 4.0 - 10.5 K/uL   RBC 4.12 3.87 - 5.11 MIL/uL   Hemoglobin 10.2 (L) 12.0 - 15.0 g/dL   HCT 16.1 (L) 09.6 - 04.5 %   MCV 76.7 (L) 78.0 - 100.0 fL   MCH 24.8 (L) 26.0 - 34.0 pg   MCHC 32.3 30.0 - 36.0 g/dL   RDW 40.9 81.1 - 91.4 %   Platelets 292 150 - 400 K/uL  Comprehensive metabolic panel     Status: Abnormal   Collection Time: 07/30/16 12:17 AM  Result Value Ref Range   Sodium 137 135 - 145 mmol/L   Potassium 3.5 3.5 - 5.1 mmol/L   Chloride 106 101 - 111 mmol/L   CO2 24 22 - 32 mmol/L   Glucose, Bld 97 65 - 99 mg/dL   BUN 5 (L) 6 - 20 mg/dL   Creatinine, Ser 7.82 (L) 0.44 - 1.00 mg/dL   Calcium 8.9 8.9 - 95.6 mg/dL   Total Protein 6.9 6.5 -  8.1 g/dL   Albumin 2.9 (L) 3.5 - 5.0 g/dL   AST 14 (L) 15 - 41 U/L   ALT 11 (L) 14 - 54 U/L   Alkaline Phosphatase 156 (H) 38 - 126 U/L   Total Bilirubin 0.4 0.3 - 1.2 mg/dL   GFR calc non Af Amer >60 >60 mL/min   GFR calc Af Amer >60 >60 mL/min   Anion gap 7 5 - 15  FHT: 130, moderate with 15x15 accels, no decels Toco: no UCs MAU Course  Procedures  MDM 0140: Patient is resting comfortably. She reports that her headache is better with tylenol.  0145: D/W Dr. Dareen PianoAnderson reviewed labs, vitals and PE, ok for DC home.  Assessment and Plan   1. Headache in pregnancy, antepartum, third trimester   2. [redacted] weeks gestation of pregnancy    DC home Comfort measures reviewed  3rd Trimester precautions  PTL precautions  Fetal kick counts RX: none, ok to use OTC tylenol to treat headache as needed  Return to MAU as needed FU with OB as planned  Follow-up Information    Levi AlandAnderson, Mark E, MD Follow up.   Specialty:  Obstetrics and Gynecology Contact information: [address] [place] [id number] [phone]            Ruth Gross 07/29/2016, 11:58 PM

## 2016-07-30 DIAGNOSIS — O26893 Other specified pregnancy related conditions, third trimester: Secondary | ICD-10-CM

## 2016-07-30 DIAGNOSIS — O9989 Other specified diseases and conditions complicating pregnancy, childbirth and the puerperium: Secondary | ICD-10-CM

## 2016-07-30 DIAGNOSIS — R51 Headache: Secondary | ICD-10-CM

## 2016-07-30 LAB — CBC
HCT: 31.6 % — ABNORMAL LOW (ref 36.0–46.0)
Hemoglobin: 10.2 g/dL — ABNORMAL LOW (ref 12.0–15.0)
MCH: 24.8 pg — AB (ref 26.0–34.0)
MCHC: 32.3 g/dL (ref 30.0–36.0)
MCV: 76.7 fL — ABNORMAL LOW (ref 78.0–100.0)
PLATELETS: 292 10*3/uL (ref 150–400)
RBC: 4.12 MIL/uL (ref 3.87–5.11)
RDW: 13.8 % (ref 11.5–15.5)
WBC: 12.7 10*3/uL — ABNORMAL HIGH (ref 4.0–10.5)

## 2016-07-30 LAB — COMPREHENSIVE METABOLIC PANEL
ALBUMIN: 2.9 g/dL — AB (ref 3.5–5.0)
ALK PHOS: 156 U/L — AB (ref 38–126)
ALT: 11 U/L — ABNORMAL LOW (ref 14–54)
ANION GAP: 7 (ref 5–15)
AST: 14 U/L — ABNORMAL LOW (ref 15–41)
BUN: 5 mg/dL — ABNORMAL LOW (ref 6–20)
CALCIUM: 8.9 mg/dL (ref 8.9–10.3)
CHLORIDE: 106 mmol/L (ref 101–111)
CO2: 24 mmol/L (ref 22–32)
Creatinine, Ser: 0.39 mg/dL — ABNORMAL LOW (ref 0.44–1.00)
GFR calc non Af Amer: >60 mL/min (ref >60)
GLUCOSE: 97 mg/dL (ref 65–99)
POTASSIUM: 3.5 mmol/L (ref 3.5–5.1)
SODIUM: 137 mmol/L (ref 135–145)
Total Bilirubin: 0.4 mg/dL (ref 0.3–1.2)
Total Protein: 6.9 g/dL (ref 6.5–8.1)

## 2016-07-30 LAB — PROTEIN / CREATININE RATIO, URINE
Creatinine, Urine: 92 mg/dL
Protein Creatinine Ratio: 0.12 mg/mg{Cre} (ref 0.00–0.15)
TOTAL PROTEIN, URINE: 11 mg/dL

## 2016-07-30 LAB — URINALYSIS, ROUTINE W REFLEX MICROSCOPIC
Bilirubin Urine: NEGATIVE
GLUCOSE, UA: 50 mg/dL — AB
Hgb urine dipstick: NEGATIVE
KETONES UR: NEGATIVE mg/dL
LEUKOCYTES UA: NEGATIVE
Nitrite: NEGATIVE
PH: 6 (ref 5.0–8.0)
Protein, ur: NEGATIVE mg/dL
Specific Gravity, Urine: 1.013 (ref 1.005–1.030)

## 2016-07-30 MED ORDER — ACETAMINOPHEN 500 MG PO TABS
1000.0000 mg | ORAL_TABLET | Freq: Once | ORAL | Status: AC
  Administered 2016-07-30: 1000 mg via ORAL
  Filled 2016-07-30: qty 2

## 2016-07-30 MED ORDER — HYDROXYZINE HCL 25 MG PO TABS
25.0000 mg | ORAL_TABLET | Freq: Once | ORAL | Status: AC
  Administered 2016-07-30: 25 mg via ORAL
  Filled 2016-07-30: qty 1

## 2016-07-30 NOTE — Discharge Instructions (Signed)
Third Trimester of Pregnancy The third trimester is from week 28 through week 40 (months 7 through 9). The third trimester is a time when the unborn baby (fetus) is growing rapidly. At the end of the ninth month, the fetus is about 20 inches in length and weighs 6-10 pounds. Body changes during your third trimester Your body will continue to go through many changes during pregnancy. The changes vary from woman to woman. During the third trimester:  Your weight will continue to increase. You can expect to gain 25-35 pounds (11-16 kg) by the end of the pregnancy.  You may begin to get stretch marks on your hips, abdomen, and breasts.  You may urinate more often because the fetus is moving lower into your pelvis and pressing on your bladder.  You may develop or continue to have heartburn. This is caused by increased hormones that slow down muscles in the digestive tract.  You may develop or continue to have constipation because increased hormones slow digestion and cause the muscles that push waste through your intestines to relax.  You may develop hemorrhoids. These are swollen veins (varicose veins) in the rectum that can itch or be painful.  You may develop swollen, bulging veins (varicose veins) in your legs.  You may have increased body aches in the pelvis, back, or thighs. This is due to weight gain and increased hormones that are relaxing your joints.  You may have changes in your hair. These can include thickening of your hair, rapid growth, and changes in texture. Some women also have hair loss during or after pregnancy, or hair that feels dry or thin. Your hair will most likely return to normal after your baby is born.  Your breasts will continue to grow and they will continue to become tender. A yellow fluid (colostrum) may leak from your breasts. This is the first milk you are producing for your baby.  Your belly button may stick out.  You may notice more swelling in your hands,  face, or ankles.  You may have increased tingling or numbness in your hands, arms, and legs. The skin on your belly may also feel numb.  You may feel short of breath because of your expanding uterus.  You may have more problems sleeping. This can be caused by the size of your belly, increased need to urinate, and an increase in your body's metabolism.  You may notice the fetus "dropping," or moving lower in your abdomen (lightening).  You may have increased vaginal discharge.  You may notice your joints feel loose and you may have pain around your pelvic bone.  What to expect at prenatal visits You will have prenatal exams every 2 weeks until week 36. Then you will have weekly prenatal exams. During a routine prenatal visit:  You will be weighed to make sure you and the baby are growing normally.  Your blood pressure will be taken.  Your abdomen will be measured to track your baby's growth.  The fetal heartbeat will be listened to.  Any test results from the previous visit will be discussed.  You may have a cervical check near your due date to see if your cervix has softened or thinned (effaced).  You will be tested for Group B streptococcus. This happens between 35 and 37 weeks.  Your health care provider may ask you:  What your birth plan is.  How you are feeling.  If you are feeling the baby move.  If you have had   any abnormal symptoms, such as leaking fluid, bleeding, severe headaches, or abdominal cramping.  If you are using any tobacco products, including cigarettes, chewing tobacco, and electronic cigarettes.  If you have any questions.  Other tests or screenings that may be performed during your third trimester include:  Blood tests that check for low iron levels (anemia).  Fetal testing to check the health, activity level, and growth of the fetus. Testing is done if you have certain medical conditions or if there are problems during the  pregnancy.  Nonstress test (NST). This test checks the health of your baby to make sure there are no signs of problems, such as the baby not getting enough oxygen. During this test, a belt is placed around your belly. The baby is made to move, and its heart rate is monitored during movement.  What is false labor? False labor is a condition in which you feel small, irregular tightenings of the muscles in the womb (contractions) that usually go away with rest, changing position, or drinking water. These are called Braxton Hicks contractions. Contractions may last for hours, days, or even weeks before true labor sets in. If contractions come at regular intervals, become more frequent, increase in intensity, or become painful, you should see your health care provider. What are the signs of labor?  Abdominal cramps.  Regular contractions that start at 10 minutes apart and become stronger and more frequent with time.  Contractions that start on the top of the uterus and spread down to the lower abdomen and back.  Increased pelvic pressure and dull back pain.  A watery or bloody mucus discharge that comes from the vagina.  Leaking of amniotic fluid. This is also known as your "water breaking." It could be a slow trickle or a gush. Let your health care provider know if it has a color or strange odor. If you have any of these signs, call your health care provider right away, even if it is before your due date. Follow these instructions at home: Medicines  Follow your health care provider's instructions regarding medicine use. Specific medicines may be either safe or unsafe to take during pregnancy.  Take a prenatal vitamin that contains at least 600 micrograms (mcg) of folic acid.  If you develop constipation, try taking a stool softener if your health care provider approves. Eating and drinking  Eat a balanced diet that includes fresh fruits and vegetables, whole grains, good sources of protein  such as meat, eggs, or tofu, and low-fat dairy. Your health care provider will help you determine the amount of weight gain that is right for you.  Avoid raw meat and uncooked cheese. These carry germs that can cause birth defects in the baby.  If you have low calcium intake from food, talk to your health care provider about whether you should take a daily calcium supplement.  Eat four or five small meals rather than three large meals a day.  Limit foods that are high in fat and processed sugars, such as fried and sweet foods.  To prevent constipation: ? Drink enough fluid to keep your urine clear or pale yellow. ? Eat foods that are high in fiber, such as fresh fruits and vegetables, whole grains, and beans. Activity  Exercise only as directed by your health care provider. Most women can continue their usual exercise routine during pregnancy. Try to exercise for 30 minutes at least 5 days a week. Stop exercising if you experience uterine contractions.  Avoid heavy   lifting.  Do not exercise in extreme heat or humidity, or at high altitudes.  Wear low-heel, comfortable shoes.  Practice good posture.  You may continue to have sex unless your health care provider tells you otherwise. Relieving pain and discomfort  Take frequent breaks and rest with your legs elevated if you have leg cramps or low back pain.  Take warm sitz baths to soothe any pain or discomfort caused by hemorrhoids. Use hemorrhoid cream if your health care provider approves.  Wear a good support bra to prevent discomfort from breast tenderness.  If you develop varicose veins: ? Wear support pantyhose or compression stockings as told by your healthcare provider. ? Elevate your feet for 15 minutes, 3-4 times a day. Prenatal care  Write down your questions. Take them to your prenatal visits.  Keep all your prenatal visits as told by your health care provider. This is important. Safety  Wear your seat belt at  all times when driving.  Make a list of emergency phone numbers, including numbers for family, friends, the hospital, and police and fire departments. General instructions  Avoid cat litter boxes and soil used by cats. These carry germs that can cause birth defects in the baby. If you have a cat, ask someone to clean the litter box for you.  Do not travel far distances unless it is absolutely necessary and only with the approval of your health care provider.  Do not use hot tubs, steam rooms, or saunas.  Do not drink alcohol.  Do not use any products that contain nicotine or tobacco, such as cigarettes and e-cigarettes. If you need help quitting, ask your health care provider.  Do not use any medicinal herbs or unprescribed drugs. These chemicals affect the formation and growth of the baby.  Do not douche or use tampons or scented sanitary pads.  Do not cross your legs for long periods of time.  To prepare for the arrival of your baby: ? Take prenatal classes to understand, practice, and ask questions about labor and delivery. ? Make a trial run to the hospital. ? Visit the hospital and tour the maternity area. ? Arrange for maternity or paternity leave through employers. ? Arrange for family and friends to take care of pets while you are in the hospital. ? Purchase a rear-facing car seat and make sure you know how to install it in your car. ? Pack your hospital bag. ? Prepare the baby's nursery. Make sure to remove all pillows and stuffed animals from the baby's crib to prevent suffocation.  Visit your dentist if you have not gone during your pregnancy. Use a soft toothbrush to brush your teeth and be gentle when you floss. Contact a health care provider if:  You are unsure if you are in labor or if your water has broken.  You become dizzy.  You have mild pelvic cramps, pelvic pressure, or nagging pain in your abdominal area.  You have lower back pain.  You have persistent  nausea, vomiting, or diarrhea.  You have an unusual or bad smelling vaginal discharge.  You have pain when you urinate. Get help right away if:  Your water breaks before 37 weeks.  You have regular contractions less than 5 minutes apart before 37 weeks.  You have a fever.  You are leaking fluid from your vagina.  You have spotting or bleeding from your vagina.  You have severe abdominal pain or cramping.  You have rapid weight loss or weight gain.    You have shortness of breath with chest pain.  You notice sudden or extreme swelling of your face, hands, ankles, feet, or legs.  Your baby makes fewer than 10 movements in 2 hours.  You have severe headaches that do not go away when you take medicine.  You have vision changes. Summary  The third trimester is from week 28 through week 40, months 7 through 9. The third trimester is a time when the unborn baby (fetus) is growing rapidly.  During the third trimester, your discomfort may increase as you and your baby continue to gain weight. You may have abdominal, leg, and back pain, sleeping problems, and an increased need to urinate.  During the third trimester your breasts will keep growing and they will continue to become tender. A yellow fluid (colostrum) may leak from your breasts. This is the first milk you are producing for your baby.  False labor is a condition in which you feel small, irregular tightenings of the muscles in the womb (contractions) that eventually go away. These are called Braxton Hicks contractions. Contractions may last for hours, days, or even weeks before true labor sets in.  Signs of labor can include: abdominal cramps; regular contractions that start at 10 minutes apart and become stronger and more frequent with time; watery or bloody mucus discharge that comes from the vagina; increased pelvic pressure and dull back pain; and leaking of amniotic fluid. This information is not intended to replace advice  given to you by your health care provider. Make sure you discuss any questions you have with your health care provider. Document Released: 02/24/2001 Document Revised: 08/08/2015 Document Reviewed: 05/03/2012 Elsevier Interactive Patient Education  2017 [employer]  

## 2016-08-11 LAB — OB RESULTS CONSOLE GBS: STREP GROUP B AG: NEGATIVE

## 2016-08-24 ENCOUNTER — Telehealth (HOSPITAL_COMMUNITY): Payer: Self-pay | Admitting: *Deleted

## 2016-08-24 ENCOUNTER — Encounter (HOSPITAL_COMMUNITY): Payer: Self-pay

## 2016-08-24 NOTE — Telephone Encounter (Signed)
Preadmission screen  

## 2016-08-25 ENCOUNTER — Encounter (HOSPITAL_COMMUNITY): Payer: Self-pay

## 2016-08-31 ENCOUNTER — Other Ambulatory Visit: Payer: Self-pay | Admitting: Obstetrics & Gynecology

## 2016-08-31 NOTE — Patient Instructions (Signed)
20 Thomes LollingKristal D Clowers  08/31/2016   Your procedure is scheduled on:  09/02/2016  [facility].  Pick up the phone at the desk and dial [phone].   Call this number if you have problems the morning of surgery: [phone]   Remember:   Do not eat food:After Midnight.  Do not drink clear liquids: After Midnight.  Take these medicines the morning of surgery with A SIP OF WATER: none   Do not wear jewelry, make-up or nail polish.  Do not wear lotions, powders, or perfumes. Do not wear deodorant.  Do not shave 48 hours prior to surgery.  Do not bring valuables to the hospital.  [facility] not   responsible for any belongings or valuables brought to the hospital.  Contacts, dentures or bridgework may not be worn into surgery.  Leave suitcase in the car. After surgery it may be brought to your room.  For patients admitted to the hospital, checkout time is 11:00 AM the day of              discharge.   Patients discharged the day of surgery will not be allowed to drive             home.  Name and phone number of your driver: na  Special Instructions:   N/A   Please read over the following fact sheets that you were given:   Surgical Site Infection Prevention

## 2016-09-01 ENCOUNTER — Encounter (HOSPITAL_COMMUNITY)
Admission: RE | Admit: 2016-09-01 | Discharge: 2016-09-01 | Disposition: A | Discharge status: Home / Self Care | Payer: Medicaid Other | Source: Ambulatory Visit | Attending: Obstetrics & Gynecology | Admitting: Obstetrics & Gynecology

## 2016-09-01 HISTORY — DX: Heart disease, unspecified: I51.9

## 2016-09-01 HISTORY — DX: Personal history of other infectious and parasitic diseases: Z86.19

## 2016-09-01 HISTORY — DX: Infections of kidney in pregnancy, unspecified trimester: O23.00

## 2016-09-01 LAB — CBC
HCT: 32.4 % — ABNORMAL LOW (ref 36.0–46.0)
Hemoglobin: 10.3 g/dL — ABNORMAL LOW (ref 12.0–15.0)
MCH: 23.7 pg — AB (ref 26.0–34.0)
MCHC: 31.8 g/dL (ref 30.0–36.0)
MCV: 74.5 fL — AB (ref 78.0–100.0)
PLATELETS: 269 10*3/uL (ref 150–400)
RBC: 4.35 MIL/uL (ref 3.87–5.11)
RDW: 15 % (ref 11.5–15.5)
WBC: 9.5 10*3/uL (ref 4.0–10.5)

## 2016-09-01 LAB — TYPE AND SCREEN
ABO/RH(D): O POS
Antibody Screen: NEGATIVE

## 2016-09-01 NOTE — Anesthesia Preprocedure Evaluation (Addendum)
Anesthesia Evaluation  Patient identified by MRN, date of birth, ID band Patient awake    Reviewed: Allergy & Precautions, H&P , NPO status , Patient's Chart, lab work & pertinent test results, reviewed documented beta blocker date and time   History of Anesthesia Complications Negative for: history of anesthetic complications  Airway Mallampati: III  TM Distance: >3 FB Neck ROM: full    Dental  (+) Teeth Intact   Pulmonary asthma (mild, controlled) ,  Inhaler 2-3 times a month   breath sounds clear to auscultation       Cardiovascular negative cardio ROS   Rhythm:regular Rate:Normal     Neuro/Psych negative neurological ROS  negative psych ROS   GI/Hepatic negative GI ROS, Neg liver ROS,   Endo/Other  negative endocrine ROS  Renal/GU negative Renal ROS     Musculoskeletal   Abdominal   Peds  Hematology negative hematology ROS (+) anemia ,   Anesthesia Other Findings   Reproductive/Obstetrics (+) Pregnancy                            Anesthesia Physical  Anesthesia Plan  ASA: II  Anesthesia Plan: Spinal   Post-op Pain Management:    Induction:   PONV Risk Score and Plan: 2 and Ondansetron and Treatment may vary due to age or medical condition  Airway Management Planned:   Additional Equipment:   Intra-op Plan:   Post-operative Plan:   Informed Consent: I have reviewed the patients History and Physical, chart, labs and discussed the procedure including the risks, benefits and alternatives for the proposed anesthesia with the patient or authorized representative who has indicated his/her understanding and acceptance.     Plan Discussed with: CRNA  Anesthesia Plan Comments:        Anesthesia Quick Evaluation

## 2016-09-02 ENCOUNTER — Encounter (HOSPITAL_COMMUNITY): Payer: Self-pay

## 2016-09-02 ENCOUNTER — Encounter (HOSPITAL_COMMUNITY)
Admission: RE | Disposition: A | Discharge status: Home / Self Care | Payer: Self-pay | Source: Ambulatory Visit | Attending: Obstetrics & Gynecology

## 2016-09-02 ENCOUNTER — Inpatient Hospital Stay (HOSPITAL_COMMUNITY): Payer: Medicaid Other | Admitting: Anesthesiology

## 2016-09-02 ENCOUNTER — Inpatient Hospital Stay (HOSPITAL_COMMUNITY)
Admission: RE | Admit: 2016-09-02 | Discharge: 2016-09-05 | DRG: 765 | Disposition: A | Discharge status: Home / Self Care | Payer: Medicaid Other | Source: Ambulatory Visit | Attending: Obstetrics & Gynecology | Admitting: Obstetrics & Gynecology

## 2016-09-02 DIAGNOSIS — O9952 Diseases of the respiratory system complicating childbirth: Secondary | ICD-10-CM | POA: Diagnosis present

## 2016-09-02 DIAGNOSIS — J45909 Unspecified asthma, uncomplicated: Secondary | ICD-10-CM | POA: Diagnosis present

## 2016-09-02 DIAGNOSIS — D62 Acute posthemorrhagic anemia: Secondary | ICD-10-CM | POA: Diagnosis not present

## 2016-09-02 DIAGNOSIS — O34211 Maternal care for low transverse scar from previous cesarean delivery: Secondary | ICD-10-CM | POA: Diagnosis present

## 2016-09-02 DIAGNOSIS — Z3A39 39 weeks gestation of pregnancy: Secondary | ICD-10-CM | POA: Diagnosis not present

## 2016-09-02 DIAGNOSIS — O9081 Anemia of the puerperium: Secondary | ICD-10-CM | POA: Diagnosis not present

## 2016-09-02 DIAGNOSIS — D509 Iron deficiency anemia, unspecified: Secondary | ICD-10-CM | POA: Diagnosis present

## 2016-09-02 DIAGNOSIS — Z98891 History of uterine scar from previous surgery: Secondary | ICD-10-CM

## 2016-09-02 LAB — RPR: RPR Ser Ql: NONREACTIVE

## 2016-09-02 SURGERY — Surgical Case
Anesthesia: Spinal | Site: Abdomen | Wound class: Clean Contaminated

## 2016-09-02 MED ORDER — SIMETHICONE 80 MG PO CHEW: 80.0000 mg | CHEWABLE_TABLET | ORAL | Status: DC | PRN

## 2016-09-02 MED ORDER — FENTANYL CITRATE (PF) 100 MCG/2ML IJ SOLN
INTRAMUSCULAR | Status: DC | PRN
  Administered 2016-09-02: 10 ug via INTRAVENOUS

## 2016-09-02 MED ORDER — DIPHENHYDRAMINE HCL 25 MG PO CAPS: 25.0000 mg | ORAL_CAPSULE | Freq: Four times a day (QID) | ORAL | Status: DC | PRN

## 2016-09-02 MED ORDER — TETANUS-DIPHTH-ACELL PERTUSSIS 5-2.5-18.5 LF-MCG/0.5 IM SUSP
0.5000 mL | Freq: Once | INTRAMUSCULAR | Status: DC
Start: 2016-09-03 — End: 2016-09-05

## 2016-09-02 MED ORDER — SIMETHICONE 80 MG PO CHEW
80.0000 mg | CHEWABLE_TABLET | ORAL | Status: DC
  Administered 2016-09-02 – 2016-09-04 (×3): 80 mg via ORAL
  Filled 2016-09-02 (×3): qty 1

## 2016-09-02 MED ORDER — NALBUPHINE HCL 10 MG/ML IJ SOLN
INTRAMUSCULAR | Status: AC
  Filled 2016-09-02: qty 1

## 2016-09-02 MED ORDER — BUPIVACAINE HCL (PF) 0.25 % IJ SOLN
INTRAMUSCULAR | Status: AC
  Filled 2016-09-02: qty 30

## 2016-09-02 MED ORDER — SCOPOLAMINE 1 MG/3DAYS TD PT72: 1.0000 | MEDICATED_PATCH | Freq: Once | TRANSDERMAL | Status: DC

## 2016-09-02 MED ORDER — NALBUPHINE HCL 10 MG/ML IJ SOLN: 5.0000 mg | INTRAMUSCULAR | Status: DC | PRN

## 2016-09-02 MED ORDER — WITCH HAZEL-GLYCERIN EX PADS: 1.0000 "application " | MEDICATED_PAD | CUTANEOUS | Status: DC | PRN

## 2016-09-02 MED ORDER — MORPHINE SULFATE (PF) 0.5 MG/ML IJ SOLN
INTRAMUSCULAR | Status: AC
  Filled 2016-09-02: qty 10

## 2016-09-02 MED ORDER — DIPHENHYDRAMINE HCL 50 MG/ML IJ SOLN: 12.5000 mg | INTRAMUSCULAR | Status: DC | PRN

## 2016-09-02 MED ORDER — PHENYLEPHRINE 40 MCG/ML (10ML) SYRINGE FOR IV PUSH (FOR BLOOD PRESSURE SUPPORT)
PREFILLED_SYRINGE | INTRAVENOUS | Status: AC
  Filled 2016-09-02: qty 20

## 2016-09-02 MED ORDER — OXYCODONE HCL 5 MG PO TABS
10.0000 mg | ORAL_TABLET | ORAL | Status: DC | PRN
  Administered 2016-09-03 – 2016-09-05 (×5): 10 mg via ORAL
  Filled 2016-09-02 (×5): qty 2

## 2016-09-02 MED ORDER — OXYTOCIN 40 UNITS IN LACTATED RINGERS INFUSION - SIMPLE MED: 2.5000 [IU]/h | INTRAVENOUS | Status: AC

## 2016-09-02 MED ORDER — ONDANSETRON HCL 4 MG/2ML IJ SOLN
INTRAMUSCULAR | Status: DC | PRN
  Administered 2016-09-02: 4 mg via INTRAVENOUS

## 2016-09-02 MED ORDER — DIPHENHYDRAMINE HCL 25 MG PO CAPS
25.0000 mg | ORAL_CAPSULE | ORAL | Status: DC | PRN
  Administered 2016-09-03: 25 mg via ORAL
  Filled 2016-09-02 (×2): qty 1

## 2016-09-02 MED ORDER — SCOPOLAMINE 1 MG/3DAYS TD PT72
MEDICATED_PATCH | TRANSDERMAL | Status: AC
  Filled 2016-09-02: qty 1

## 2016-09-02 MED ORDER — NALBUPHINE HCL 10 MG/ML IJ SOLN
5.0000 mg | Freq: Once | INTRAMUSCULAR | Status: AC | PRN
  Administered 2016-09-02: 5 mg via SUBCUTANEOUS

## 2016-09-02 MED ORDER — DIBUCAINE 1 % RE OINT: 1.0000 "application " | TOPICAL_OINTMENT | RECTAL | Status: DC | PRN

## 2016-09-02 MED ORDER — ACETAMINOPHEN 325 MG PO TABS
650.0000 mg | ORAL_TABLET | ORAL | Status: DC | PRN
  Administered 2016-09-03 – 2016-09-05 (×6): 650 mg via ORAL
  Filled 2016-09-02 (×6): qty 2

## 2016-09-02 MED ORDER — DEXAMETHASONE SODIUM PHOSPHATE 4 MG/ML IJ SOLN
INTRAMUSCULAR | Status: AC
  Filled 2016-09-02: qty 1

## 2016-09-02 MED ORDER — MORPHINE SULFATE (PF) 0.5 MG/ML IJ SOLN
INTRAMUSCULAR | Status: DC | PRN
  Administered 2016-09-02: .2 mg via EPIDURAL

## 2016-09-02 MED ORDER — IBUPROFEN 600 MG PO TABS
600.0000 mg | ORAL_TABLET | Freq: Four times a day (QID) | ORAL | Status: DC
  Administered 2016-09-02 – 2016-09-05 (×12): 600 mg via ORAL
  Filled 2016-09-02 (×12): qty 1

## 2016-09-02 MED ORDER — FENTANYL CITRATE (PF) 100 MCG/2ML IJ SOLN: 25.0000 ug | INTRAMUSCULAR | Status: DC | PRN

## 2016-09-02 MED ORDER — PHENYLEPHRINE HCL 10 MG/ML IJ SOLN
INTRAMUSCULAR | Status: DC | PRN
  Administered 2016-09-02 (×3): 40 ug via INTRAVENOUS
  Administered 2016-09-02 (×2): 80 ug via INTRAVENOUS

## 2016-09-02 MED ORDER — BUPIVACAINE HCL (PF) 0.25 % IJ SOLN
INTRAMUSCULAR | Status: DC | PRN
  Administered 2016-09-02: 30 mL

## 2016-09-02 MED ORDER — BUPIVACAINE IN DEXTROSE 0.75-8.25 % IT SOLN
INTRATHECAL | Status: DC | PRN
  Administered 2016-09-02: 1.4 mL via INTRATHECAL

## 2016-09-02 MED ORDER — PRENATAL MULTIVITAMIN CH
1.0000 | ORAL_TABLET | Freq: Every day | ORAL | Status: DC
  Administered 2016-09-02 – 2016-09-05 (×4): 1 via ORAL
  Filled 2016-09-02 (×4): qty 1

## 2016-09-02 MED ORDER — OXYTOCIN 10 UNIT/ML IJ SOLN
INTRAVENOUS | Status: DC | PRN
  Administered 2016-09-02: 40 [IU] via INTRAVENOUS

## 2016-09-02 MED ORDER — NALOXONE HCL 2 MG/2ML IJ SOSY
1.0000 ug/kg/h | PREFILLED_SYRINGE | INTRAVENOUS | Status: DC | PRN
  Filled 2016-09-02: qty 2

## 2016-09-02 MED ORDER — SENNOSIDES-DOCUSATE SODIUM 8.6-50 MG PO TABS
2.0000 | ORAL_TABLET | ORAL | Status: DC
  Administered 2016-09-02 – 2016-09-04 (×3): 2 via ORAL
  Filled 2016-09-02 (×3): qty 2

## 2016-09-02 MED ORDER — NALOXONE HCL 0.4 MG/ML IJ SOLN: 0.4000 mg | INTRAMUSCULAR | Status: DC | PRN

## 2016-09-02 MED ORDER — PHENYLEPHRINE 8 MG IN D5W 100 ML (0.08MG/ML) PREMIX OPTIME
INJECTION | INTRAVENOUS | Status: AC
  Filled 2016-09-02: qty 100

## 2016-09-02 MED ORDER — KETOROLAC TROMETHAMINE 30 MG/ML IJ SOLN: 30.0000 mg | Freq: Four times a day (QID) | INTRAMUSCULAR | Status: AC | PRN

## 2016-09-02 MED ORDER — OXYCODONE HCL 5 MG PO TABS
5.0000 mg | ORAL_TABLET | ORAL | Status: DC | PRN
  Administered 2016-09-03 – 2016-09-05 (×4): 5 mg via ORAL
  Filled 2016-09-02 (×4): qty 1

## 2016-09-02 MED ORDER — LACTATED RINGERS IV SOLN
INTRAVENOUS | Status: DC
  Administered 2016-09-02: 15:00:00 via INTRAVENOUS
  Administered 2016-09-02: 125 mL/h via INTRAVENOUS

## 2016-09-02 MED ORDER — SIMETHICONE 80 MG PO CHEW
80.0000 mg | CHEWABLE_TABLET | Freq: Three times a day (TID) | ORAL | Status: DC
  Administered 2016-09-02 – 2016-09-05 (×9): 80 mg via ORAL
  Filled 2016-09-02 (×9): qty 1

## 2016-09-02 MED ORDER — MENTHOL 3 MG MT LOZG
1.0000 | LOZENGE | OROMUCOSAL | Status: DC | PRN
  Filled 2016-09-02: qty 9

## 2016-09-02 MED ORDER — LACTATED RINGERS IV SOLN
INTRAVENOUS | Status: DC
  Administered 2016-09-02: 08:00:00 via INTRAVENOUS

## 2016-09-02 MED ORDER — SOD CITRATE-CITRIC ACID 500-334 MG/5ML PO SOLN
ORAL | Status: AC
  Filled 2016-09-02: qty 15

## 2016-09-02 MED ORDER — FAMOTIDINE 20 MG PO TABS
20.0000 mg | ORAL_TABLET | Freq: Once | ORAL | Status: AC
  Administered 2016-09-02: 20 mg via ORAL
  Filled 2016-09-02: qty 1

## 2016-09-02 MED ORDER — PROMETHAZINE HCL 25 MG/ML IJ SOLN: 6.2500 mg | INTRAMUSCULAR | Status: DC | PRN

## 2016-09-02 MED ORDER — DEXAMETHASONE SODIUM PHOSPHATE 4 MG/ML IJ SOLN
INTRAMUSCULAR | Status: DC | PRN
  Administered 2016-09-02: 4 mg via INTRAVENOUS

## 2016-09-02 MED ORDER — ONDANSETRON HCL 4 MG/2ML IJ SOLN
INTRAMUSCULAR | Status: AC
  Filled 2016-09-02: qty 2

## 2016-09-02 MED ORDER — ZOLPIDEM TARTRATE 5 MG PO TABS: 5.0000 mg | ORAL_TABLET | Freq: Every evening | ORAL | Status: DC | PRN

## 2016-09-02 MED ORDER — EPHEDRINE SULFATE 50 MG/ML IJ SOLN
INTRAMUSCULAR | Status: DC | PRN
  Administered 2016-09-02 (×2): 5 mg via INTRAVENOUS

## 2016-09-02 MED ORDER — COCONUT OIL OIL
1.0000 "application " | TOPICAL_OIL | Status: DC | PRN
  Administered 2016-09-04: 1 via TOPICAL
  Filled 2016-09-02: qty 120

## 2016-09-02 MED ORDER — SODIUM CHLORIDE 0.9% FLUSH: 3.0000 mL | INTRAVENOUS | Status: DC | PRN

## 2016-09-02 MED ORDER — NALBUPHINE HCL 10 MG/ML IJ SOLN: 5.0000 mg | Freq: Once | INTRAMUSCULAR | Status: AC | PRN

## 2016-09-02 MED ORDER — MEPERIDINE HCL 25 MG/ML IJ SOLN: 6.2500 mg | INTRAMUSCULAR | Status: DC | PRN

## 2016-09-02 MED ORDER — CEFAZOLIN SODIUM-DEXTROSE 2-4 GM/100ML-% IV SOLN
2.0000 g | INTRAVENOUS | Status: AC
  Administered 2016-09-02: 2 g via INTRAVENOUS
  Filled 2016-09-02: qty 100

## 2016-09-02 MED ORDER — NALBUPHINE HCL 10 MG/ML IJ SOLN
5.0000 mg | INTRAMUSCULAR | Status: DC | PRN
  Administered 2016-09-02: 5 mg via SUBCUTANEOUS

## 2016-09-02 MED ORDER — METHYLERGONOVINE MALEATE 0.2 MG/ML IJ SOLN: 0.2000 mg | INTRAMUSCULAR | Status: DC | PRN

## 2016-09-02 MED ORDER — SOD CITRATE-CITRIC ACID 500-334 MG/5ML PO SOLN
30.0000 mL | Freq: Once | ORAL | Status: AC
  Administered 2016-09-02: 30 mL via ORAL

## 2016-09-02 MED ORDER — METHYLERGONOVINE MALEATE 0.2 MG PO TABS: 0.2000 mg | ORAL_TABLET | ORAL | Status: DC | PRN

## 2016-09-02 MED ORDER — SODIUM CHLORIDE 0.9 % IR SOLN
Status: DC | PRN
  Administered 2016-09-02 (×2): 1

## 2016-09-02 MED ORDER — KETOROLAC TROMETHAMINE 30 MG/ML IJ SOLN
INTRAMUSCULAR | Status: AC
  Administered 2016-09-02: 30 mg via INTRAMUSCULAR
  Filled 2016-09-02: qty 1

## 2016-09-02 MED ORDER — FENTANYL CITRATE (PF) 100 MCG/2ML IJ SOLN
INTRAMUSCULAR | Status: AC
  Filled 2016-09-02: qty 2

## 2016-09-02 MED ORDER — ONDANSETRON HCL 4 MG/2ML IJ SOLN: 4.0000 mg | Freq: Three times a day (TID) | INTRAMUSCULAR | Status: DC | PRN

## 2016-09-02 SURGICAL SUPPLY — 38 items
BENZOIN TINCTURE PRP APPL 2/3 (GAUZE/BANDAGES/DRESSINGS) ×2 IMPLANT
CHLORAPREP W/TINT 26ML (MISCELLANEOUS) ×2 IMPLANT
CLAMP CORD UMBIL (MISCELLANEOUS) IMPLANT
CLOSURE STERI STRIP 1/2 X4 (GAUZE/BANDAGES/DRESSINGS) ×2 IMPLANT
CLOTH BEACON ORANGE TIMEOUT ST (SAFETY) ×2 IMPLANT
DRSG OPSITE POSTOP 4X10 (GAUZE/BANDAGES/DRESSINGS) ×2 IMPLANT
ELECT REM PT RETURN 9FT ADLT (ELECTROSURGICAL) ×2
ELECTRODE REM PT RTRN 9FT ADLT (ELECTROSURGICAL) ×1 IMPLANT
EXTRACTOR VACUUM BELL STYLE (SUCTIONS) ×2 IMPLANT
EXTRACTOR VACUUM KIWI (MISCELLANEOUS) IMPLANT
GAUZE SPONGE 4X4 12PLY STRL LF (GAUZE/BANDAGES/DRESSINGS) ×4 IMPLANT
GLOVE BIO SURGEON STRL SZ 6.5 (GLOVE) ×4 IMPLANT
GLOVE BIOGEL M 6.5 STRL (GLOVE) ×4 IMPLANT
GLOVE BIOGEL PI IND STRL 7.0 (GLOVE) ×2 IMPLANT
GLOVE BIOGEL PI INDICATOR 7.0 (GLOVE) ×2
GOWN STRL REUS W/TWL LRG LVL3 (GOWN DISPOSABLE) ×6 IMPLANT
KIT ABG SYR 3ML LUER SLIP (SYRINGE) ×2 IMPLANT
NEEDLE HYPO 22GX1.5 SAFETY (NEEDLE) IMPLANT
NEEDLE HYPO 25X5/8 SAFETYGLIDE (NEEDLE) ×2 IMPLANT
NS IRRIG 1000ML POUR BTL (IV SOLUTION) ×2 IMPLANT
PACK C SECTION WH (CUSTOM PROCEDURE TRAY) ×2 IMPLANT
PAD ABD 7.5X8 STRL (GAUZE/BANDAGES/DRESSINGS) ×2 IMPLANT
PAD OB MATERNITY 4.3X12.25 (PERSONAL CARE ITEMS) ×2 IMPLANT
PENCIL SMOKE EVAC W/HOLSTER (ELECTROSURGICAL) ×2 IMPLANT
RTRCTR C-SECT PINK 25CM LRG (MISCELLANEOUS) ×2 IMPLANT
STRIP CLOSURE SKIN 1/2X4 (GAUZE/BANDAGES/DRESSINGS) ×2 IMPLANT
SUT CHROMIC 2 0 CT 1 (SUTURE) ×4 IMPLANT
SUT MNCRL 0 VIOLET CTX 36 (SUTURE) ×2 IMPLANT
SUT MONOCRYL 0 CTX 36 (SUTURE) ×2
SUT PDS AB 0 CTX 36 PDP370T (SUTURE) ×4 IMPLANT
SUT PLAIN 2 0 (SUTURE)
SUT PLAIN ABS 2-0 CT1 27XMFL (SUTURE) IMPLANT
SUT VIC AB 0 CTX 36 (SUTURE) ×2
SUT VIC AB 0 CTX36XBRD ANBCTRL (SUTURE) ×2 IMPLANT
SUT VIC AB 4-0 KS 27 (SUTURE) ×2 IMPLANT
SYR CONTROL 10ML LL (SYRINGE) IMPLANT
TOWEL OR 17X24 6PK STRL BLUE (TOWEL DISPOSABLE) ×2 IMPLANT
TRAY FOLEY BAG SILVER LF 14FR (SET/KITS/TRAYS/PACK) ×2 IMPLANT

## 2016-09-02 NOTE — Op Note (Signed)
Cesarean Section Procedure Note  Indications: 21 yo G3P1011 at 39 weeks scheduled for an elective repeat cesarean delivery; Fetal Bradycardia after spinal in OR  Pre-operative Diagnosis:  1. One prior Low Transverse cesarean section 2. Fetal bradycardia in the 50's in operating room after spinal.  Post-operative Diagnosis: same  Surgeon: Wynonia HazardPINN, Nemiah Bubar [name] MD  Assistants: Marlow BaarsLARK, DYANNA  MD  Anesthesia: Spinal anesthesia  ASA Class: 2  Procedure Details   The patient was counseled about the risks, benefits, complications of the cesarean section using an interpreter. The patient concurred with the proposed plan, giving informed consent.  The site of surgery properly noted/marked. The patient was taken to Operating Room # 9, identified as Ruth Gross and the procedure verified as C-Section Delivery. A Time Out was held and the above information confirmed.  After spinal anesthesia was found to be adequate, the patient was placed in the dorsal supine position with a leftward tilt, the fetal heart beat was difficult to find and then once found sounded sluggish in the 50's. The abdomen was splashed with betadine,and draped. A Pfannenstiel incision was made and carried down through the subcutaneous tissue to the fascia.  The fascia was incised in the midline and the fascial incision was opened bluntly followed by opening the abdominal peritoneum bluntly using surgeons fingers. The bladder blade was placed and the [facility] retractor used for visualization. The scalpel was then used to make a low transverse incision on the uterus which was extended laterally with  blunt dissection. The fetal vertex was identified, and the head delivered with aid of a Mity Vac and pushing by assisting MD on the uterine fundus.  The head was then delivered  through the uterine incision followed by the body. The A live female infant was bulb suctioned on the operative field cried vigorously, cord was clamped and cut and  the infant was passed to the waiting neonatologist. Apgars 8/9. Placenta was then delivered spontaneously, intact and appeared normal, the uterus was cleared of all clot and debris. The [employer] Retractor was then deployed for better visualization.  The uterine incision was repaired with #0 Monocryl in running locked fashion. The incision was hemostatic so the incision was closed in one layer.  Ovaries and tubes were inspected and normal. The Alexis retractor was removed. The abdominal cavity was cleared of all clot and debris. The abdominal peritoneum was reapproximated with 2-0 chromic  in a running fashion, the rectus muscles was reapproximated with #2 chromic in interrupted fashion. The fascia was closed with 0 PDS in a running fashion from either end of the incision meeting in the middle.  The subcuticular layer was irrigated and all bleeders cauterized. 30 cc of 0.25% Marcaine was injected into the subcutaneous layer.   The subcuticular layer was re-approximated with interrupted suture of 2-0 plain gut     The skin was closed with 4-0 vicryl in a subcuticular fashion using a Keith needle. The incision was dressed with benzoine, steri strips and pressure dressing. All sponge lap and needle counts were correct x3. Patient tolerated the procedure well and recovered in stable condition following the procedure.  Instrument, sponge, and needle counts were correct x 3 prior the abdominal closure and at the conclusion of the case.   Findings: Live female infant, Apgars 9/9, clear amniotic fluid, normal appearing placenta, normal uterus, bilateral tubes and ovaries  Estimated Blood Loss: 500mL  IVF: 3000 mL LR         Drains: Foley catheter 100 mL  clear urine at end of the procedure.          Specimens: Placenta to L&D; cord gases drawn.          Implants: none         Complications:  None; patient tolerated the procedure well.         Disposition: PACU - hemodynamically stable.   Ruth Gross  [name]

## 2016-09-02 NOTE — Lactation Note (Signed)
This note was copied from a baby's chart. Lactation Consultation Note  Patient Name: Ruth Gross ZOXWR'UToday's Date: 09/02/2016   Attempted to visit at 6 hours of age, but mom has room full of visitors and is being assessed by nurse at this time.   Maternal Data    Feeding Feeding Type: Breast Fed Length of feed: 5 min  LATCH Score/Interventions                      Lactation Tools Discussed/Used     Consult Status      [name] 09/02/2016, 3:41 PM

## 2016-09-02 NOTE — Lactation Note (Signed)
This note was copied from a baby's chart. Lactation Consultation Note  Patient Name: Ruth Baird LyonsKristal Mehta XBMWU'XToday's Date: 09/02/2016 Reason for consult: Initial assessment   Initial consult at 7 hrs old; Mom is a P2, mom stated she tried to breastfeed previous child but infant would not latch.   Ga 40.3; BW 7 lbs, 8.5 oz.   Infant has breastfed x2 (12030 min) + attempt x1 (5 min) since birth 7 hrs ago; voids-1; stools-0.   Reviewed hand expression with mom and mom began to drip colostrum.   Taught how to use football hold left side; taught how to sandwich breast and latch with asymmetrical latching technique.  Assisted using teacup hold.  Taught chin tug for assuring flanging of lips. Infant easily latched and fed with a consistent sucking pattern; several swallows heard; LS-8.   Educated on size of infant's stomach, cluster feeding, and feeding with cues.   Lactation brochure given and informed of hospital support group and OP services.   Encouraged to call for assistance as needed with latching.     Maternal Data Has patient been taught Hand Expression?: Yes (with return demonstration and observation of colostrum dripping from breast)  Feeding Feeding Type: Breast Fed Length of feed: 5 min  LATCH Score/Interventions Latch: Grasps breast easily, tongue down, lips flanged, rhythmical sucking. Intervention(s): Assist with latch;Breast compression  Audible Swallowing: A few with stimulation Intervention(s): Skin to skin;Hand expression  Type of Nipple: Everted at rest and after stimulation  Comfort (Breast/Nipple): Soft / non-tender     Hold (Positioning): Assistance needed to correctly position infant at breast and maintain latch. Intervention(s): Skin to skin;Support Pillows;Breastfeeding basics reviewed  LATCH Score: 8  Lactation Tools Discussed/Used WIC Program: Yes   Consult Status Consult Status: Follow-up Date: 09/03/16 Follow-up type: In-patient    Ruth Gross,  Ruth Gross 09/02/2016, 4:44 PM

## 2016-09-02 NOTE — Anesthesia Postprocedure Evaluation (Signed)
Anesthesia Post Note  Patient: Ruth Gross  Procedure(s) Performed: Procedure(s) (LRB): CESAREAN SECTION (N/A)     Patient location during evaluation: Mother Baby Anesthesia Type: Spinal Level of consciousness: awake and alert and oriented Pain management: pain level controlled Vital Signs Assessment: post-procedure vital signs reviewed and stable Respiratory status: spontaneous breathing and nonlabored ventilation Cardiovascular status: stable Postop Assessment: no headache, patient able to bend at knees, no backache, no signs of nausea or vomiting, spinal receding and adequate PO intake Anesthetic complications: no    Last Vitals:  Vitals:   09/02/16 1529 09/02/16 1707  BP: (!) 111/54 109/74  Pulse: 62   Resp:  18  Temp: 36.7 C 36.5 C    Last Pain:  Vitals:   09/02/16 1707  TempSrc: Oral  PainSc:    Pain Goal:                 Laban EmperorMalinova,Maeve Debord Hristova

## 2016-09-02 NOTE — Transfer of Care (Signed)
Immediate Anesthesia Transfer of Care Note  Patient: Ruth Gross  Procedure(s) Performed: Procedure(s): CESAREAN SECTION (N/A)  Patient Location: PACU  Anesthesia Type:Spinal  Level of Consciousness: awake, alert  and oriented  Airway & Oxygen Therapy: Patient Spontanous Breathing  Post-op Assessment: Report given to RN and Post -op Vital signs reviewed and stable  Post vital signs: Reviewed and stable  Last Vitals:  Vitals:   09/02/16 0723  BP: 123/80  Pulse: (!) 104  Temp: 37.1 C    Last Pain:  Vitals:   09/02/16 0723  TempSrc: Oral         Complications: No apparent anesthesia complications

## 2016-09-02 NOTE — H&P (Signed)
Ruth Gross is a 21 y.o. female G3P1 presenting for elective repeat c-section at 39 weeks She c/o ctx, no vaginal bleeding, no lof, no FM  OB History    Gravida Para Term Preterm AB Living   3 1 1   1 1    SAB TAB Ectopic Multiple Live Births   1       1     Past Medical History:  Diagnosis Date  . Anemia   . Asthma   . Chronic kidney disease   . Headache   . Heart problem    Pt states she had a "heart attack" in September.  [facility] indicated discharged after normal testing with flexeril and naproxen.    Marland Kitchen. Hx of chlamydia infection   . Hx of pyelonephritis during pregnancy 04/2016  . Pyelonephritis affecting pregnancy   . Vertigo    Past Surgical History:  Procedure Laterality Date  . CESAREAN SECTION N/A 02/10/2013   Procedure: CESAREAN SECTION;  Surgeon: Levi AlandMark E Anderson, MD;  Location: [facility];  Service: Obstetrics;  Laterality: N/A;  . DILATION AND EVACUATION  04/10/2012   Procedure: DILATATION AND EVACUATION;  Surgeon: Mickel Baasichard D Kaplan, MD;  Location: [facility];  Service: Gynecology;  Laterality: N/A;  . DILATION AND EVACUATION N/A 06/29/2012   Procedure: DILATATION AND EVACUATION;  Surgeon: Mickel Baasichard D Kaplan, MD;  Location: [facility];  Service: Gynecology;  Laterality: N/A;   Family History: family history includes Diabetes in her father and sister; Heart disease in her paternal grandmother and paternal uncle. Social History:  reports that she has never smoked. She has never used smokeless tobacco. She reports that she does not drink alcohol or use drugs.     Maternal Diabetes: No Genetic Screening: Normal Maternal Ultrasounds/Referrals: Normal Fetal Ultrasounds or other Referrals:  Other:  anatomy scan Maternal Substance Abuse:  No Significant Maternal Medications:  None Significant Maternal Lab Results:  None Other Comments:  None  ROS History   Blood pressure 123/80, pulse (!) 104, temperature 98.8 F (37.1 C), temperature source Oral, height 5\' 4"  (1.626 m),  weight 87.1 kg (192 lb), last menstrual period 12/07/2015, unknown if currently breastfeeding. Exam Physical Exam  Prenatal labs: ABO, Rh: --/--/O POS (06/19 1115) Antibody: NEG (06/19 1115) Rubella: Immune (11/21 0000) RPR: Non Reactive (06/19 1115)  HBsAg: Negative (11/21 0000)  HIV: Non-reactive (11/21 0000)  GBS: Negative (05/29 0000)   Assessment/Plan: 21 yo G3P1 at 39 weeks for elective repeat c-section Ancef on call to OR    Ruth Gross Ruth Gross 09/02/2016, 8:25 AM

## 2016-09-02 NOTE — Anesthesia Procedure Notes (Signed)
Spinal  Patient location during procedure: OR Start time: 09/02/2016 8:35 AM End time: 09/02/2016 8:40 AM Staffing Anesthesiologist: Karna ChristmasELLENDER, [name] Performed: anesthesiologist  Preanesthetic Checklist Completed: patient identified, surgical consent, pre-op evaluation, timeout performed, IV checked, risks and benefits discussed and monitors and equipment checked Spinal Block Patient position: sitting Prep: DuraPrep Patient monitoring: cardiac monitor, continuous pulse ox and blood pressure Approach: midline Location: L4-5 Injection technique: single-shot Needle Needle type: Pencan  Needle gauge: 24 G Needle length: 9 cm Assessment Sensory level: T10 Additional Notes Functioning IV was confirmed and monitors were applied. Sterile prep and drape, including hand hygiene and sterile gloves were used. The patient was positioned and the spine was prepped. The skin was anesthetized with lidocaine.  Free flow of clear CSF was obtained prior to injecting local anesthetic into the CSF.  The spinal needle aspirated freely following injection.  The needle was carefully withdrawn.  The patient tolerated the procedure well.

## 2016-09-03 LAB — CBC
HEMATOCRIT: 28.6 % — AB (ref 36.0–46.0)
Hemoglobin: 9 g/dL — ABNORMAL LOW (ref 12.0–15.0)
MCH: 23.7 pg — ABNORMAL LOW (ref 26.0–34.0)
MCHC: 31.5 g/dL (ref 30.0–36.0)
MCV: 75.5 fL — ABNORMAL LOW (ref 78.0–100.0)
Platelets: 235 10*3/uL (ref 150–400)
RBC: 3.79 MIL/uL — AB (ref 3.87–5.11)
RDW: 15.1 % (ref 11.5–15.5)
WBC: 12.8 10*3/uL — AB (ref 4.0–10.5)

## 2016-09-03 LAB — BIRTH TISSUE RECOVERY COLLECTION (PLACENTA DONATION)

## 2016-09-03 NOTE — Progress Notes (Signed)
Patient is doing well.  She is tolerating PO, ambulating  Foley catheter was removed yesterday and she is voiding without difficulty.  Pain is controlled.  Lochia is appropriate  Vitals:   09/02/16 1804 09/02/16 2212 09/03/16 0225 09/03/16 0515  BP:  (!) 103/50 (!) 104/47 101/63  Pulse:   62 68  Resp:  18 18 18   Temp:  98.5 F (36.9 C) 99 F (37.2 C) 98.5 F (36.9 C)  TempSrc:  Oral Oral Oral  SpO2: 97% 97%    Weight:      Height:      Uop excellent  NAD Abdomen:  soft, appropriate tenderness, incisions with pressure dressing ext:    Symmetric, +SCDs  Lab Results  Component Value Date   WBC 12.8 (H) 09/03/2016   HGB 9.0 (L) 09/03/2016   HCT 28.6 (L) 09/03/2016   MCV 75.5 (L) 09/03/2016   PLT 235 09/03/2016    --/--/O POS (06/19 1115)/RImmune  A/P    20 y.o. [id number] POD 1 s/p RCS Routine post op and postpartum care.   Anemia: ABLA on top of IDA--d/c w PO iron  Circ as outpatient

## 2016-09-04 NOTE — Lactation Note (Signed)
This note was copied from a baby's chart. Lactation Consultation Note Mom BF when entered rm. Mom states baby on the breast all night. Educated cluster feeding. Mom has large pendulum shaped breast w/very short shaft nipples. Very compressible. Baby able to obtain and maintain deep latch. Moms nipples are sore, intact. Coconut oil given. Encouraged mom to occasionally massage breast while BF to aide in baby getting more during BF.  Mom didn't BF her daughter, stated she wouldn't latch, this baby doing great. Encouraged STS, baby dressed, but it is cold in rm.  Encouraged to call for questions or concerns. Patient Name: Ruth Gross [id number]'s Date: 09/04/2016 Reason for consult: Follow-up assessment   Maternal Data    Feeding Feeding Type: Breast Fed Length of feed: 40 min  LATCH Score/Interventions Latch: Grasps breast easily, tongue down, lips flanged, rhythmical sucking.  Audible Swallowing: Spontaneous and intermittent  Type of Nipple: Everted at rest and after stimulation (short shaft)  Comfort (Breast/Nipple): Filling, red/small blisters or bruises, mild/mod discomfort  Problem noted: Mild/Moderate discomfort Interventions (Mild/moderate discomfort): Hand expression;Hand massage (coconut oil)  Hold (Positioning): No assistance needed to correctly position infant at breast. Intervention(s): Support Pillows;Breastfeeding basics reviewed;Skin to skin  LATCH Score: 9  Lactation Tools Discussed/Used Tools: Other (comment) (coconut oil)   Consult Status Consult Status: Follow-up Date: 09/05/16 Follow-up type: In-patient    Charyl DancerCARVER, Deseree Zemaitis G 09/04/2016, 6:42 AM

## 2016-09-04 NOTE — Discharge Summary (Signed)
Obstetric Discharge Summary Reason for Admission: cesarean section Prenatal Procedures: Ultrasound Intrapartum Procedures: cesarean: low cervical, transverse Postpartum Procedures: none Complications-Operative and Postpartum: none Hemoglobin  Date Value Ref Range Status  09/03/2016 9.0 (L) 12.0 - 15.0 g/dL Final   HCT  Date Value Ref Range Status  09/03/2016 28.6 (L) 36.0 - 46.0 % Final    Physical Exam:  General: alert, cooperative and appears stated age 36Lochia: appropriate Uterine Fundus: firm Incision: healing well DVT Evaluation: No evidence of DVT seen on physical exam.  Discharge Diagnoses: Term Pregnancy-delivered  Discharge Information: Date: 09/04/2016 Activity: pelvic rest Diet: routine Medications: Ibuprofen, Colace and Percocet Condition: improved Instructions: refer to practice specific booklet Discharge to: home Follow-up Information    Essie HartPinn, Walda, MD Follow up in 4 week(s).   Specialty:  Obstetrics and Gynecology Why:  For a postpartum evaluation Contact information: [address] [place] [address] [phone]           Newborn Data: Live born female  Birth Weight: 7 lb 8.5 oz (3415 g) APGAR: 8, 9  Home with mother.  Ruth Gross. 09/04/2016, 9:07 AM

## 2016-09-04 NOTE — Lactation Note (Signed)
This note was copied from a baby's chart. Lactation Consultation Note  Baby 53 hours old and asleep on mother's chest after approx 40 min feeding. Mother denies problems or questions. Discussed cluster feeding which baby did last night. Mother's nipples intact, no trauma noted. Mom encouraged to feed baby 8-12 times/24 hours and with feeding cues.    Patient Name: Ruth Gross [id number]'QToday's Date: 09/04/2016 Reason for consult: Follow-up assessment   Maternal Data    Feeding Feeding Type: Breast Fed Length of feed: 40 min  LATCH Score/Interventions Latch: Grasps breast easily, tongue down, lips flanged, rhythmical sucking. Intervention(s): Adjust position;Assist with latch;Breast massage;Breast compression  Audible Swallowing: Spontaneous and intermittent Intervention(s): Hand expression  Type of Nipple: Everted at rest and after stimulation  Comfort (Breast/Nipple): Filling, red/small blisters or bruises, mild/mod discomfort  Problem noted: Mild/Moderate discomfort Interventions (Mild/moderate discomfort): Hand expression  Hold (Positioning): Assistance needed to correctly position infant at breast and maintain latch.  LATCH Score: 8  Lactation Tools Discussed/Used     Consult Status Consult Status: Follow-up Date: 09/05/16 Follow-up type: In-patient    Dahlia ByesBerkelhammer, Ruth [facility] 09/04/2016, 1:58 PM

## 2016-09-04 NOTE — Progress Notes (Signed)
Patient removed pressure dressing after shower.  Dressing is peeling up on one corner and appears to be about 75% saturated.  Dr. Tenny Crawoss called to get order for dressing change.  Will continue to monitor.

## 2016-09-04 NOTE — Progress Notes (Signed)
Subjective: Postpartum Day 2: Cesarean Delivery Patient reports no nausea or vomiting, tolerating po. Daughter hit pateint in the incision yesterday, today more tender than she expected. Would like to stay an additional day  Objective: Vital signs in last 24 hours:    Physical Exam:  General: alert, cooperative and appears stated age Lochia: appropriate Uterine Fundus: firm Incision: pressure dressing still in pkace DVT Evaluation: No evidence of DVT seen on physical exam.   Recent Labs  09/03/16 0633  HGB 9.0*  HCT 28.6*    Assessment/Plan: Status post Cesarean section. Doing well postoperatively.  Continue current care.  Liticia Gasior H. 09/04/2016, 4:26 PM

## 2016-09-05 MED ORDER — IBUPROFEN 600 MG PO TABS: 600.0000 mg | ORAL_TABLET | Freq: Four times a day (QID) | ORAL | 0 refills | Status: DC | PRN

## 2016-09-05 MED ORDER — DOCUSATE SODIUM 100 MG PO CAPS: 100.0000 mg | ORAL_CAPSULE | Freq: Two times a day (BID) | ORAL | 0 refills | Status: DC

## 2016-09-05 MED ORDER — OXYCODONE-ACETAMINOPHEN 5-325 MG PO TABS: 1.0000 | ORAL_TABLET | ORAL | 0 refills | Status: DC | PRN

## 2017-02-26 ENCOUNTER — Encounter: Payer: Self-pay | Admitting: Emergency Medicine

## 2017-02-26 ENCOUNTER — Other Ambulatory Visit: Payer: Self-pay

## 2017-02-26 DIAGNOSIS — N189 Chronic kidney disease, unspecified: Secondary | ICD-10-CM | POA: Insufficient documentation

## 2017-02-26 DIAGNOSIS — B349 Viral infection, unspecified: Secondary | ICD-10-CM | POA: Insufficient documentation

## 2017-02-26 DIAGNOSIS — R55 Syncope and collapse: Secondary | ICD-10-CM | POA: Insufficient documentation

## 2017-02-26 DIAGNOSIS — J45909 Unspecified asthma, uncomplicated: Secondary | ICD-10-CM | POA: Insufficient documentation

## 2017-02-26 DIAGNOSIS — Z79899 Other long term (current) drug therapy: Secondary | ICD-10-CM | POA: Insufficient documentation

## 2017-02-26 NOTE — ED Triage Notes (Signed)
Pt ambulatory to triage with no difficulty. Pt reports 2 to 3 days of n/v/d, cough, congestion and fever. States she thinks she has the flu. Reports fever of 101 at home

## 2017-02-27 ENCOUNTER — Emergency Department: Payer: Self-pay

## 2017-02-27 ENCOUNTER — Emergency Department
Admission: EM | Admit: 2017-02-27 | Discharge: 2017-02-27 | Disposition: A | Discharge status: Home / Self Care | Payer: Self-pay | Attending: Emergency Medicine | Admitting: Emergency Medicine

## 2017-02-27 DIAGNOSIS — R55 Syncope and collapse: Secondary | ICD-10-CM

## 2017-02-27 DIAGNOSIS — B349 Viral infection, unspecified: Secondary | ICD-10-CM

## 2017-02-27 LAB — URINALYSIS, COMPLETE (UACMP) WITH MICROSCOPIC
Bilirubin Urine: NEGATIVE
Glucose, UA: NEGATIVE mg/dL
Ketones, ur: NEGATIVE mg/dL
Leukocytes, UA: NEGATIVE
Nitrite: NEGATIVE
PROTEIN: 100 mg/dL — AB
SPECIFIC GRAVITY, URINE: 1.023 (ref 1.005–1.030)
pH: 7 (ref 5.0–8.0)

## 2017-02-27 LAB — INFLUENZA PANEL BY PCR (TYPE A & B)
INFLBPCR: NEGATIVE
Influenza A By PCR: NEGATIVE

## 2017-02-27 LAB — CBC
HEMATOCRIT: 40.4 % (ref 35.0–47.0)
Hemoglobin: 13.5 g/dL (ref 12.0–16.0)
MCH: 26.6 pg (ref 26.0–34.0)
MCHC: 33.4 g/dL (ref 32.0–36.0)
MCV: 79.8 fL — AB (ref 80.0–100.0)
Platelets: 293 10*3/uL (ref 150–440)
RBC: 5.07 MIL/uL (ref 3.80–5.20)
RDW: 15.7 % — ABNORMAL HIGH (ref 11.5–14.5)
WBC: 6.9 10*3/uL (ref 3.6–11.0)

## 2017-02-27 LAB — BASIC METABOLIC PANEL
Anion gap: 10 (ref 5–15)
BUN: 8 mg/dL (ref 6–20)
CALCIUM: 9.2 mg/dL (ref 8.9–10.3)
CHLORIDE: 102 mmol/L (ref 101–111)
CO2: 27 mmol/L (ref 22–32)
CREATININE: 0.58 mg/dL (ref 0.44–1.00)
GFR calc Af Amer: >60 mL/min (ref >60)
GFR calc non Af Amer: >60 mL/min (ref >60)
GLUCOSE: 91 mg/dL (ref 65–99)
Potassium: 3.9 mmol/L (ref 3.5–5.1)
Sodium: 139 mmol/L (ref 135–145)

## 2017-02-27 LAB — TROPONIN I

## 2017-02-27 LAB — POCT PREGNANCY, URINE: Preg Test, Ur: NEGATIVE

## 2017-02-27 LAB — FIBRIN DERIVATIVES D-DIMER (ARMC ONLY): FIBRIN DERIVATIVES D-DIMER (ARMC): 208.61 ng{FEU}/mL (ref 0.00–499.00)

## 2017-02-27 LAB — MONONUCLEOSIS SCREEN: MONO SCREEN: NEGATIVE

## 2017-02-27 MED ORDER — SODIUM CHLORIDE 0.9 % IV BOLUS (SEPSIS)
1000.0000 mL | Freq: Once | INTRAVENOUS | Status: AC
  Administered 2017-02-27: 1000 mL via INTRAVENOUS

## 2017-02-27 NOTE — ED Provider Notes (Signed)
[facility] Emergency Department Provider Note   ____________________________________________   First MD Initiated Contact with Patient 02/27/17 0103     (approximate)  I have reviewed the triage vital signs and the nursing notes.   HISTORY  Chief Complaint Passed out at work    HPI Ruth Gross is Ruth 21 y.o. female tells me she was not really sure how to describe all of her symptoms so she called him "flulike" when she checked in.  She relates that over the last 1 month she has been having Ruth occasional dry nonproductive cough.  At times she has been feeling very fatigued.  She has had Ruth couple episodes of passing out briefly while standing at work.  Today she reports she was standing out by at work and she passed out briefly, her coworkers told her she looked pale and encouraged her to come for evaluation so she came tonight.  She did report that she has had fevers off and on for the last couple of weeks, she had Ruth fever to 101 last night.  She reports that she has had Ruth cough some slight nasal congestion as well for about the last 2-3 weeks.  She has relatively heavy menstrual cycles for the last several years, and reports that Ruth long time ago she was told that her hemoglobin was about 7 because of her heavy periods and she was put on iron tablets.  She does not smoke.  She does not take any hormones.  She has had Ruth couple episodes of what she describes Ruth very brief in the home somewhat hard to describe discomfort in her chest that will come off and on and last only Ruth few seconds over the last months time as well.  No leg swelling.  No history of blood clots.  She does relate that she thinks she is lost approximately 20+ pounds in the last month from just feeling fatigued but continues to eat well eats pizza at work, and reports she is not sure why she is lost the weight.  No abdominal pain nausea or vomiting.  Past Medical History:  Diagnosis Date  . Anemia   .  Asthma   . Chronic kidney disease   . Headache   . Heart problem    Pt states she had Ruth "heart attack" in September.  [facility] ED note indicated discharged after normal testing with flexeril and naproxen.    Marland Kitchen Hx of chlamydia infection   . Hx of pyelonephritis during pregnancy 04/2016  . Pyelonephritis affecting pregnancy   . Vertigo     Patient Active Problem List   Diagnosis Date Noted  . Status post repeat low transverse cesarean section 09/02/2016  . Pyelonephritis 05/09/2016  . Supervision of other normal pregnancy 02/11/2013  . Pregnancy 02/09/2013  . Anemia 06/16/2012  . History of miscarriage 06/16/2012    Past Surgical History:  Procedure Laterality Date  . CESAREAN SECTION N/Ruth 02/10/2013   Procedure: CESAREAN SECTION;  Surgeon: Levi Aland, MD;  Location: [facility];  Service: Obstetrics;  Laterality: N/Ruth;  . CESAREAN SECTION N/Ruth 09/02/2016   Procedure: CESAREAN SECTION;  Surgeon: Essie Hart, MD;  Location: [facility] BIRTHING SUITES;  Service: Obstetrics;  Laterality: N/Ruth;  . DILATION AND EVACUATION  04/10/2012   Procedure: DILATATION AND EVACUATION;  Surgeon: Mickel Baas, MD;  Location: [facility];  Service: Gynecology;  Laterality: N/Ruth;  . DILATION AND EVACUATION N/Ruth 06/29/2012   Procedure: DILATATION AND EVACUATION;  Surgeon:  Mickel Baas, MD;  Location: [facility];  Service: Gynecology;  Laterality: N/Ruth;    Prior to Admission medications   Medication Sig Start Date End Date Taking? Authorizing Provider  docusate sodium (COLACE) 100 MG capsule Take 1 capsule (100 mg total) by mouth 2 (two) times daily. 09/05/16   Waynard Reeds, MD  ibuprofen (ADVIL,MOTRIN) 600 MG tablet Take 1 tablet (600 mg total) by mouth every 6 (six) hours as needed. 09/05/16   Waynard Reeds, MD  oxyCODONE-acetaminophen (ROXICET) 5-325 MG tablet Take 1-2 tablets by mouth every 4 (four) hours as needed for severe pain. 09/05/16   Waynard Reeds, MD    Allergies Patient has no known allergies.  Family History    Problem Relation Age of Onset  . Diabetes Father   . Diabetes Sister   . Heart disease Paternal Uncle   . Heart disease Paternal Grandmother   . Other Neg Hx     Social History Social History   Tobacco Use  . Smoking status: Never Smoker  . Smokeless tobacco: Never Used  Substance Use Topics  . Alcohol use: No  . Drug use: No    Review of Systems Constitutional: Some fatigue, no chills but has had Ruth fever off and on at times for the last couple of weeks Eyes: No visual changes. ENT: No sore throat.  Feels scratchy at times. Cardiovascular: See HPI Respiratory: Denies shortness of breath.  Wheezing.  Dry cough at times Gastrointestinal: No abdominal pain.  No nausea, no vomiting.  No diarrhea.  No constipation. Genitourinary: Negative for dysuria.  Denies pregnancy. Musculoskeletal: Negative for back pain. Skin: Negative for rash. Neurological: Negative for headaches, focal weakness or numbness.    ____________________________________________   PHYSICAL EXAM:  VITAL SIGNS: ED Triage Vitals  Enc Vitals Group     BP 02/26/17 2335 125/78     Pulse Rate 02/26/17 2335 (!) 116     Resp 02/26/17 2335 18     Temp 02/26/17 2335 98.6 F (37 C)     Temp Source 02/26/17 2335 Oral     SpO2 02/26/17 2335 99 %     Weight 02/26/17 2327 175 lb (79.4 kg)     Height 02/26/17 2327 5\' 4"  (1.626 m)     Head Circumference --      Peak Flow --      Pain Score 02/26/17 2327 8     Pain Loc --      Pain Edu? --      Excl. in GC? --     Constitutional: Alert and oriented. Well appearing and in no acute distress. Eyes: Conjunctivae are slightly pale. Head: Atraumatic. Nose: No congestion/rhinnorhea. Mouth/Throat: Mucous membranes are moist.  No oropharyngeal exudates.  Tonsils appear normal.  No anterior cervical adenopathy. Neck: No stridor.   Cardiovascular: Normal rate, regular rhythm.  However, when I do have her sit up her heart rate elevates to approximately 100.  Grossly  normal heart sounds.  Good peripheral circulation. Respiratory: Normal respiratory effort.  No retractions. Lungs CTAB.  No cough noted. Gastrointestinal: Soft and nontender. No distention. Musculoskeletal: No lower extremity tenderness nor edema. Neurologic:  Normal speech and language. No gross focal neurologic deficits are appreciated.  Skin:  Skin is warm, dry and intact. No rash noted. Psychiatric: Mood and affect are normal. Speech and behavior are normal.  ____________________________________________   LABS (all labs ordered are listed, but only abnormal results are displayed)  Labs Reviewed  CBC - Abnormal; Notable for  the following components:      Result Value   MCV 79.8 (*)    RDW 15.7 (*)    All other components within normal limits  URINALYSIS, COMPLETE (UACMP) WITH MICROSCOPIC - Abnormal; Notable for the following components:   Color, Urine AMBER (*)    APPearance HAZY (*)    Hgb urine dipstick LARGE (*)    Protein, ur 100 (*)    Bacteria, UA RARE (*)    Squamous Epithelial / LPF TOO NUMEROUS TO COUNT (*)    All other components within normal limits  URINE CULTURE  INFLUENZA PANEL BY PCR (TYPE Ruth & B)  BASIC METABOLIC PANEL  FIBRIN DERIVATIVES D-DIMER ([facility] ONLY)  TROPONIN I  MONONUCLEOSIS SCREEN  POC URINE PREG, ED  POCT PREGNANCY, URINE   ____________________________________________  EKG  Reviewed interrupt me at 2 AM Heart rate 99 QRS 80 QTC 430 Normal sinus rhythm, no evidence of acute ischemia ectopy or prolongation of the QT.  No WPW.  No Brugada ____________________________________________  RADIOLOGY  Dg Chest 2 View  Result Date: 02/27/2017 CLINICAL DATA:  Cough for 1 month.  Weakness and syncope today. EXAM: CHEST  2 VIEW COMPARISON:  Radiographs and CT 12/10/2015 FINDINGS: The cardiomediastinal contours are normal. The lungs are clear. Pulmonary vasculature is normal. No consolidation, pleural effusion, or pneumothorax. No acute osseous  abnormalities are seen. IMPRESSION: No acute abnormality or explanation for cough. Electronically Signed   By: Rubye OaksMelanie  Ehinger M.D.   On: 02/27/2017 02:22    Chest x-ray reviewed no acute ____________________________________________   PROCEDURES  Procedure(s) performed: None  Procedures  Critical Care performed: No  ____________________________________________   INITIAL IMPRESSION / ASSESSMENT AND PLAN / ED COURSE  Pertinent labs & imaging results that were available during my care of the patient were reviewed by me and considered in my medical decision making (see chart for details).  Patient presents for evaluation of Ruth syncopal episode today, also reports having had weight loss unintentionally, and ongoing cough, some atypical chest discomfort off and on for about Ruth month, and last couple of weeks having intermittent fevers.  She denies any significant travel history.  She is afebrile with reassuring vital signs here but was initially noted to be tachycardic and her heart rate does elevate with sitting up.  She does have Ruth history of anemia in the past.  Differential is quite broad, but I would certainly like to know what her hemoglobin is today.  She is overall well-appearing at this time, we will hydrate her, check labs, she is low risk for pulmonary embolism by well's criteria and I will send Ruth d-dimer.  Also some troponin check EKG.  Hydrate and reevaluate.  Clinical Course as of Feb 28 408  Sat Feb 27, 2017  [id number] Reviewed results with patient.  Resting comfortably.  She reports feeling improved.  Currently in no distress, did review labs and consideration of her symptoms.  Patient is agreeable to wait for result which has been added.  She appears well, I suspect some type of indolent viral illness with no evidence of acute bacterial illness noted by examination today with very reassuring workup.  Negative d-dimer.  Alert, no complaints at this time.  Patient does not have Ruth PCP  so we spent extensive time talking about how she may secure 1, if she has Medicaid and will call to obtain primary care information on Monday.  Return precautions and treatment recommendations and follow-up discussed with the patient who is  agreeable with the plan.   [MQ]    Clinical Course User Index [MQ] Sharyn CreamerQuale, Camren Henthorn, MD     ____________________________________________   FINAL CLINICAL IMPRESSION(S) / ED DIAGNOSES  Final diagnoses:  Viral syndrome  Syncope, unspecified syncope type      NEW MEDICATIONS STARTED DURING THIS VISIT:  This SmartLink is deprecated. Use AVSMEDLIST instead to display the medication list for Ruth patient.   Note:  This document was prepared using Dragon voice recognition software and may include unintentional dictation errors.     Sharyn CreamerQuale, Robbert Langlinais, MD 02/27/17 [phone]

## 2017-02-27 NOTE — Discharge Instructions (Signed)
You have been seen in the Emergency Department (ED) today for a likely viral illness.    Please stay well hydrated, make sure you set up close follow-up the primary care doctor.  Please return the emergency room right away if you have any worsening or concerning symptoms.   Rreturn to the Emergency Department (ED) if you are unable to tolerate fluids due to vomiting, have worsening trouble breathing, pass out again, become extremely tired or difficult to awaken, or if you develop any other symptoms that concern you.

## 2017-02-28 LAB — URINE CULTURE: Special Requests: NORMAL

## 2017-06-03 ENCOUNTER — Encounter: Payer: Self-pay | Admitting: Maternal Newborn

## 2017-07-09 ENCOUNTER — Other Ambulatory Visit: Payer: Self-pay

## 2017-07-09 ENCOUNTER — Encounter: Payer: Self-pay | Admitting: Emergency Medicine

## 2017-07-09 DIAGNOSIS — J45909 Unspecified asthma, uncomplicated: Secondary | ICD-10-CM | POA: Insufficient documentation

## 2017-07-09 DIAGNOSIS — O99519 Diseases of the respiratory system complicating pregnancy, unspecified trimester: Secondary | ICD-10-CM | POA: Insufficient documentation

## 2017-07-09 DIAGNOSIS — R05 Cough: Secondary | ICD-10-CM | POA: Insufficient documentation

## 2017-07-09 DIAGNOSIS — O210 Mild hyperemesis gravidarum: Secondary | ICD-10-CM | POA: Insufficient documentation

## 2017-07-09 DIAGNOSIS — R55 Syncope and collapse: Secondary | ICD-10-CM | POA: Insufficient documentation

## 2017-07-09 DIAGNOSIS — O9989 Other specified diseases and conditions complicating pregnancy, childbirth and the puerperium: Secondary | ICD-10-CM | POA: Insufficient documentation

## 2017-07-09 LAB — URINALYSIS, COMPLETE (UACMP) WITH MICROSCOPIC
Bilirubin Urine: NEGATIVE
Glucose, UA: NEGATIVE mg/dL
Hgb urine dipstick: NEGATIVE
KETONES UR: 20 mg/dL — AB
Leukocytes, UA: NEGATIVE
Nitrite: NEGATIVE
PROTEIN: NEGATIVE mg/dL
Specific Gravity, Urine: 1.014 (ref 1.005–1.030)
pH: 6 (ref 5.0–8.0)

## 2017-07-09 LAB — BASIC METABOLIC PANEL
ANION GAP: 7 (ref 5–15)
BUN: 8 mg/dL (ref 6–20)
CHLORIDE: 103 mmol/L (ref 101–111)
CO2: 23 mmol/L (ref 22–32)
Calcium: 8.9 mg/dL (ref 8.9–10.3)
Creatinine, Ser: 0.41 mg/dL — ABNORMAL LOW (ref 0.44–1.00)
GFR calc non Af Amer: >60 mL/min (ref >60)
GLUCOSE: 95 mg/dL (ref 65–99)
Potassium: 3.4 mmol/L — ABNORMAL LOW (ref 3.5–5.1)
Sodium: 133 mmol/L — ABNORMAL LOW (ref 135–145)

## 2017-07-09 LAB — CBC
HCT: 37.6 % (ref 35.0–47.0)
HEMOGLOBIN: 13.3 g/dL (ref 12.0–16.0)
MCH: 29.2 pg (ref 26.0–34.0)
MCHC: 35.2 g/dL (ref 32.0–36.0)
MCV: 82.9 fL (ref 80.0–100.0)
Platelets: 257 10*3/uL (ref 150–440)
RBC: 4.54 MIL/uL (ref 3.80–5.20)
RDW: 14.6 % — ABNORMAL HIGH (ref 11.5–14.5)
WBC: 8.2 10*3/uL (ref 3.6–11.0)

## 2017-07-09 LAB — HCG, QUANTITATIVE, PREGNANCY: HCG, BETA CHAIN, QUANT, S: 168764 m[IU]/mL — AB (ref <5)

## 2017-07-09 LAB — POCT PREGNANCY, URINE: PREG TEST UR: POSITIVE — AB

## 2017-07-09 NOTE — ED Triage Notes (Signed)
Patient states that she is about [redacted] weeks pregnant. Patient states that she has passed out times three in the last two days. Patient states that she has also had an increase her vomiting and unable to keep any food down. Patient states that she has also had a cough.

## 2017-07-10 ENCOUNTER — Emergency Department: Payer: Self-pay

## 2017-07-10 ENCOUNTER — Emergency Department
Admission: EM | Admit: 2017-07-10 | Discharge: 2017-07-10 | Disposition: A | Discharge status: Home / Self Care | Payer: Self-pay | Attending: Emergency Medicine | Admitting: Emergency Medicine

## 2017-07-10 DIAGNOSIS — R55 Syncope and collapse: Secondary | ICD-10-CM

## 2017-07-10 DIAGNOSIS — R6889 Other general symptoms and signs: Secondary | ICD-10-CM

## 2017-07-10 DIAGNOSIS — O210 Mild hyperemesis gravidarum: Secondary | ICD-10-CM

## 2017-07-10 LAB — INFLUENZA PANEL BY PCR (TYPE A & B)
Influenza A By PCR: NEGATIVE
Influenza B By PCR: NEGATIVE

## 2017-07-10 LAB — TROPONIN I
Troponin I: <0.03 ng/mL (ref <0.03)
Troponin I: <0.03 ng/mL (ref <0.03)

## 2017-07-10 MED ORDER — SODIUM CHLORIDE 0.9 % IV BOLUS: 1000.0000 mL | Freq: Once | INTRAVENOUS | Status: DC

## 2017-07-10 MED ORDER — METOCLOPRAMIDE HCL 5 MG/ML IJ SOLN
10.0000 mg | Freq: Once | INTRAMUSCULAR | Status: AC
  Administered 2017-07-10: 10 mg via INTRAVENOUS
  Filled 2017-07-10: qty 2

## 2017-07-10 MED ORDER — METOCLOPRAMIDE HCL 10 MG PO TABS: 10.0000 mg | ORAL_TABLET | Freq: Three times a day (TID) | ORAL | 0 refills | Status: DC | PRN

## 2017-07-10 MED ORDER — SODIUM CHLORIDE 0.9 % IV BOLUS
1000.0000 mL | Freq: Once | INTRAVENOUS | Status: AC
  Administered 2017-07-10: 1000 mL via INTRAVENOUS

## 2017-07-10 NOTE — ED Provider Notes (Signed)
[facility] Emergency Department Provider Note   ____________________________________________   First MD Initiated Contact with Patient 07/10/17 0040     (approximate)  I have reviewed the triage vital signs and the nursing notes.   HISTORY  Chief Complaint Loss of Consciousness; Emesis; and Cough    HPI Ruth Gross is a 22 y.o. female who comes into the hospital today with some dizziness and syncope.  The patient reports that she is passed out 3 times since yesterday.  She states that she has been coughing and gagging enough to make her vomit and she has had body aches and chills.  The patient is [redacted] weeks pregnant.  She has had no energy as well.  The symptoms started 2 days ago.  The patient has not checked her temperature at home.  The patient states that the last time she passed out was around 2:03 PM.  She states that she has been unable to eat in the last 2 days but she is able to drink some.  Her emesis looks like form and is green with some spots of blood.  She recently moved here and does not have an OB for this pregnancy.  She has not yet seen anyone.  The patient has been pregnant before and states that this is different.  She denies any sick contacts.  The patient is here today for evaluation.   Past Medical History:  Diagnosis Date  . Anemia   . Asthma   . Chronic kidney disease   . Headache   . Heart problem    Pt states she had a "heart attack" in September.  [facility] ED note indicated discharged after normal testing with flexeril and naproxen.    Marland Kitchen Hx of chlamydia infection   . Hx of pyelonephritis during pregnancy 04/2016  . Pyelonephritis affecting pregnancy   . Vertigo     Patient Active Problem List   Diagnosis Date Noted  . Status post repeat low transverse cesarean section 09/02/2016  . Pyelonephritis 05/09/2016  . Supervision of other normal pregnancy 02/11/2013  . Pregnancy 02/09/2013  . Anemia 06/16/2012  . History of  miscarriage 06/16/2012    Past Surgical History:  Procedure Laterality Date  . CESAREAN SECTION N/A 02/10/2013   Procedure: CESAREAN SECTION;  Surgeon: Levi Aland, MD;  Location: [facility] ORS;  Service: Obstetrics;  Laterality: N/A;  . CESAREAN SECTION N/A 09/02/2016   Procedure: CESAREAN SECTION;  Surgeon: Essie Hart, MD;  Location: [facility] BIRTHING SUITES;  Service: Obstetrics;  Laterality: N/A;  . DILATION AND EVACUATION  04/10/2012   Procedure: DILATATION AND EVACUATION;  Surgeon: Mickel Baas, MD;  Location: [facility] ORS;  Service: Gynecology;  Laterality: N/A;  . DILATION AND EVACUATION N/A 06/29/2012   Procedure: DILATATION AND EVACUATION;  Surgeon: Mickel Baas, MD;  Location: [facility] ORS;  Service: Gynecology;  Laterality: N/A;    Prior to Admission medications   Medication Sig Start Date End Date Taking? Authorizing Provider  docusate sodium (COLACE) 100 MG capsule Take 1 capsule (100 mg total) by mouth 2 (two) times daily. 09/05/16   Waynard Reeds, MD  ibuprofen (ADVIL,MOTRIN) 600 MG tablet Take 1 tablet (600 mg total) by mouth every 6 (six) hours as needed. 09/05/16   Waynard Reeds, MD  metoCLOPramide (REGLAN) 10 MG tablet Take 1 tablet (10 mg total) by mouth every 8 (eight) hours as needed. 07/10/17   Rebecka Apley, MD  oxyCODONE-acetaminophen (ROXICET) 5-325 MG tablet Take 1-2 tablets by  mouth every 4 (four) hours as needed for severe pain. 09/05/16   Waynard Reeds, MD    Allergies Patient has no known allergies.  Family History  Problem Relation Age of Onset  . Diabetes Father   . Diabetes Sister   . Heart disease Paternal Uncle   . Heart disease Paternal Grandmother   . Other Neg Hx     Social History Social History   Tobacco Use  . Smoking status: Never Smoker  . Smokeless tobacco: Never Used  Substance Use Topics  . Alcohol use: No  . Drug use: No    Review of Systems  Constitutional: No fever/chills Eyes: No visual changes. ENT: No sore throat. Cardiovascular:  Denies chest pain. Respiratory: Denies shortness of breath. Gastrointestinal: Nausea and vomiting with No abdominal pain. No diarrhea.  No constipation. Genitourinary: Negative for dysuria. Musculoskeletal: Negative for back pain. Skin: Negative for rash. Neurological: Syncope   ____________________________________________   PHYSICAL EXAM:  VITAL SIGNS: ED Triage Vitals  Enc Vitals Group     BP 07/09/17 2230 (!) 129/53     Pulse Rate 07/09/17 2230 95     Resp 07/09/17 2230 18     Temp 07/09/17 2230 98.3 F (36.8 C)     Temp Source 07/09/17 2230 Oral     SpO2 07/09/17 2230 98 %     Weight 07/09/17 2231 180 lb (81.6 kg)     Height 07/09/17 2231  (1.6 m)     Head Circumference --      Peak Flow --      Pain Score 07/09/17 2231 7     Pain Loc --      Pain Edu? --      Excl. in GC? --     Constitutional: Alert and oriented. Well appearing and in mild distress. Eyes: Conjunctivae are normal. PERRL. EOMI. Head: Atraumatic. Nose: No congestion/rhinnorhea. Mouth/Throat: Mucous membranes are moist.  Oropharynx non-erythematous. Cardiovascular: Normal rate, regular rhythm. Grossly normal heart sounds.  Good peripheral circulation. Respiratory: Normal respiratory effort.  No retractions. Lungs CTAB. Gastrointestinal: Soft and nontender. No distention. positive bowel sounds usculoskeletal: No lower extremity tenderness nor edema.   Neurologic:  Normal speech and language.  Skin:  Skin is warm, dry and intact.  Psychiatric: Mood and affect are normal.   ____________________________________________   LABS (all labs ordered are listed, but only abnormal results are displayed)  Labs Reviewed  BASIC METABOLIC PANEL - Abnormal; Notable for the following components:      Result Value   Sodium 133 (*)    Potassium 3.4 (*)    Creatinine, Ser 0.41 (*)    All other components within normal limits  CBC - Abnormal; Notable for the following components:   RDW 14.6 (*)    All  other components within normal limits  URINALYSIS, COMPLETE (UACMP) WITH MICROSCOPIC - Abnormal; Notable for the following components:   Color, Urine YELLOW (*)    APPearance CLOUDY (*)    Ketones, ur 20 (*)    Bacteria, UA RARE (*)    All other components within normal limits  HCG, QUANTITATIVE, PREGNANCY - Abnormal; Notable for the following components:   hCG, Beta Chain, [place] Longest 161,096 (*)    All other components within normal limits  POCT PREGNANCY, URINE - Abnormal; Notable for the following components:   Preg Test, Ur POSITIVE (*)    All other components within normal limits  TROPONIN I  INFLUENZA PANEL BY PCR (TYPE A & B)  TROPONIN I   ____________________________________________  EKG  ED ECG REPORT I, Rebecka Apley, the attending physician, personally viewed and interpreted this ECG.   Date: 07/09/2017  EKG Time: 2232  Rate: 89  Rhythm: normal sinus rhythm with sinus arrythmia  Axis: normal  Intervals:none  ST&T Change: none  ____________________________________________  RADIOLOGY  ED MD interpretation:  CXR: No active cardiopulmonary disease  Official radiology report(s): Dg Chest 2 View  Result Date: 07/10/2017 CLINICAL DATA:  Ten weeks pregnant. Patient is passed out 3 times in the last 2 days. Increased vomiting. Cough. EXAM: CHEST - 2 VIEW COMPARISON:  02/27/2017 FINDINGS: The heart size and mediastinal contours are within normal limits. Both lungs are clear. The visualized skeletal structures are unremarkable. IMPRESSION: No active cardiopulmonary disease. Electronically Signed   By: Burman Nieves M.D.   On: 07/10/2017 01:08    ____________________________________________   PROCEDURES  Procedure(s) performed: None  Procedures  Critical Care performed: No  ____________________________________________   INITIAL IMPRESSION / ASSESSMENT AND PLAN / ED COURSE  As part of my medical decision making, I reviewed the following data within the  electronic MEDICAL RECORD NUMBER Notes from prior ED visits and [place] Controlled Substance Database   This is a 22 year old female who is [redacted] weeks pregnant comes into the hospital today with some vomiting, diarrhea and syncope.  My differential diagnosis includes hyperemesis gravidarum, influenza, urinary tract infection.  We did check some blood work on the patient which was unremarkable.  The patient is pregnant but her influenza was negative her troponins x2 were negative her CBC and BMP were negative.  The patient's urinalysis and chest x-ray were also negative.  I gave the patient a dose of Reglan as well as some fluids.  She did feel improved.  I feel that the patient has some hyperemesis and probably a viral illness.  She will be discharged and given the phone number for an OB/GYN with which to follow-up.  The patient will be discharged home.        ____________________________________________   FINAL CLINICAL IMPRESSION(S) / ED DIAGNOSES  Final diagnoses:  Syncope, unspecified syncope type  Hyperemesis gravidarum  Flu-like symptoms     ED Discharge Orders        Ordered    metoCLOPramide (REGLAN) 10 MG tablet  Every 8 hours PRN     07/10/17 0255       Note:  This document was prepared using Dragon voice recognition software and may include unintentional dictation errors.    Rebecka Apley, MD 07/10/17 [phone]

## 2017-07-10 NOTE — ED Notes (Signed)
ED Provider at bedside.  Pt reports "passing out" x3 since yesterday - last time at "2 or 3 o'clock today" unable to reports injury today, unable to keep anything down and coughing until vomiting, pt reports "no energy" and "continuously cold", denies contact with sick people,   2 x c-section and 1 D&C hx of asthma and anemia  Pt reports feeling like "it's the flu", pt with cough

## 2017-07-10 NOTE — Discharge Instructions (Addendum)
Please follow up with OB for further evaluation of your pregnancy and your symptoms. Please return with any worsening condition or any other concerns

## 2017-10-19 LAB — OB RESULTS CONSOLE ANTIBODY SCREEN: Antibody Screen: NEGATIVE

## 2017-10-19 LAB — OB RESULTS CONSOLE RUBELLA ANTIBODY, IGM: Rubella: IMMUNE

## 2017-10-19 LAB — OB RESULTS CONSOLE HEPATITIS B SURFACE ANTIGEN: Hepatitis B Surface Ag: NEGATIVE

## 2017-10-19 LAB — OB RESULTS CONSOLE RPR: RPR: NONREACTIVE

## 2017-10-19 LAB — OB RESULTS CONSOLE ABO/RH: RH TYPE: POSITIVE

## 2017-10-19 LAB — OB RESULTS CONSOLE HIV ANTIBODY (ROUTINE TESTING): HIV: NONREACTIVE

## 2017-10-19 LAB — OB RESULTS CONSOLE GC/CHLAMYDIA
Chlamydia: NEGATIVE
GC PROBE AMP, GENITAL: NEGATIVE

## 2017-12-18 ENCOUNTER — Observation Stay
Admission: EM | Admit: 2017-12-18 | Discharge: 2017-12-18 | Disposition: A | Discharge status: Home / Self Care | Payer: Medicaid Other | Attending: Obstetrics and Gynecology | Admitting: Obstetrics and Gynecology

## 2017-12-18 ENCOUNTER — Other Ambulatory Visit: Payer: Self-pay

## 2017-12-18 DIAGNOSIS — O26899 Other specified pregnancy related conditions, unspecified trimester: Principal | ICD-10-CM | POA: Insufficient documentation

## 2017-12-18 DIAGNOSIS — O9989 Other specified diseases and conditions complicating pregnancy, childbirth and the puerperium: Secondary | ICD-10-CM | POA: Diagnosis not present

## 2017-12-18 DIAGNOSIS — Z3A00 Weeks of gestation of pregnancy not specified: Secondary | ICD-10-CM | POA: Diagnosis not present

## 2017-12-18 DIAGNOSIS — Z3490 Encounter for supervision of normal pregnancy, unspecified, unspecified trimester: Secondary | ICD-10-CM

## 2017-12-18 DIAGNOSIS — R109 Unspecified abdominal pain: Secondary | ICD-10-CM | POA: Insufficient documentation

## 2017-12-18 DIAGNOSIS — Z3A34 34 weeks gestation of pregnancy: Secondary | ICD-10-CM | POA: Diagnosis not present

## 2017-12-18 NOTE — OB Triage Note (Signed)
Pt presents from ED with complaints of abdominal pain for 2 days. Did have intercourse last night. Endorses diarrhea over the last few days and nausea. Also complains of leaking of fluid all day.

## 2017-12-19 NOTE — Discharge Summary (Signed)
    L&D OB Triage Note  SUBJECTIVE Ruth Gross is a 22 y.o. [phone] female at Unknown, EDD Estimated Date of Delivery: None noted. who presented to triage with complaints of abdominal pain for 2 days. Did have intercourse last night. Also complains of leaking of fluid all day.  OB History  Gravida Para Term Preterm AB Living  4 2 2  0 1 2  SAB TAB Ectopic Multiple Live Births  1 0 0 0 2    # Outcome Date GA Lbr Len/2nd Weight Sex Delivery Anes PTL Lv  4 Current           3 Term 09/02/16 [redacted]w[redacted]d  3415 g M CS-Vac Spinal  LIV     Name: Littler,BOY Andee     Apgar1: 8  Apgar5: 9  2 Term 02/10/13 [redacted]w[redacted]d  3340 g F CS-LTranv EPI  LIV     Name: Reczek,GIRL Jordann     Apgar1: 8  Apgar5: 9  1 SAB 04/09/12            No medications prior to admission.     OBJECTIVE  Nursing Evaluation:   BP (!) 100/50 (BP Location: Right Arm)   Pulse 81   Temp 97.9 F (36.6 C) (Oral)   LMP 05/22/2017 (Exact Date)    Findings:   NTZ - negative.  Rare contractions.  No Evidence of ROM.  Not in labor.  NST was performed and has been reviewed by me.  NST INTERPRETATION: Category I  Mode: External Baseline Rate (A): 135 bpm Variability: Moderate Accelerations: 15 x 15 Decelerations: None     Contraction Frequency (min): occasional  ASSESSMENT Impression:  1.  Pregnancy:  [id number] at Unknown , EDD Estimated Date of Delivery: None noted. 2.  NST:  Category I  PLAN 1. Reassurance given 2. Discharge home with standard labor precautions given to return to L&D or call the office for problems. 3. Continue routine prenatal care.

## 2017-12-20 ENCOUNTER — Other Ambulatory Visit: Payer: Self-pay | Admitting: Obstetrics and Gynecology

## 2017-12-28 ENCOUNTER — Other Ambulatory Visit: Payer: Self-pay

## 2017-12-28 ENCOUNTER — Observation Stay
Admission: EM | Admit: 2017-12-28 | Discharge: 2017-12-28 | Disposition: A | Discharge status: Home / Self Care | Payer: Medicaid Other | Attending: Obstetrics and Gynecology | Admitting: Obstetrics and Gynecology

## 2017-12-28 ENCOUNTER — Observation Stay: Payer: Medicaid Other

## 2017-12-28 DIAGNOSIS — D649 Anemia, unspecified: Secondary | ICD-10-CM | POA: Insufficient documentation

## 2017-12-28 DIAGNOSIS — O99513 Diseases of the respiratory system complicating pregnancy, third trimester: Secondary | ICD-10-CM | POA: Insufficient documentation

## 2017-12-28 DIAGNOSIS — W19XXXA Unspecified fall, initial encounter: Secondary | ICD-10-CM

## 2017-12-28 DIAGNOSIS — J45909 Unspecified asthma, uncomplicated: Secondary | ICD-10-CM | POA: Diagnosis not present

## 2017-12-28 DIAGNOSIS — R1084 Generalized abdominal pain: Secondary | ICD-10-CM

## 2017-12-28 DIAGNOSIS — Z3A35 35 weeks gestation of pregnancy: Secondary | ICD-10-CM

## 2017-12-28 DIAGNOSIS — O26893 Other specified pregnancy related conditions, third trimester: Secondary | ICD-10-CM | POA: Diagnosis not present

## 2017-12-28 DIAGNOSIS — Z3A36 36 weeks gestation of pregnancy: Secondary | ICD-10-CM | POA: Diagnosis not present

## 2017-12-28 DIAGNOSIS — O9A213 Injury, poisoning and certain other consequences of external causes complicating pregnancy, third trimester: Secondary | ICD-10-CM

## 2017-12-28 DIAGNOSIS — O99013 Anemia complicating pregnancy, third trimester: Secondary | ICD-10-CM | POA: Insufficient documentation

## 2017-12-28 DIAGNOSIS — W109XXA Fall (on) (from) unspecified stairs and steps, initial encounter: Secondary | ICD-10-CM | POA: Insufficient documentation

## 2017-12-28 DIAGNOSIS — W108XXA Fall (on) (from) other stairs and steps, initial encounter: Secondary | ICD-10-CM | POA: Diagnosis not present

## 2017-12-28 MED ORDER — ACETAMINOPHEN 500 MG PO TABS: 1000.0000 mg | ORAL_TABLET | Freq: Four times a day (QID) | ORAL | Status: DC | PRN

## 2017-12-28 MED ORDER — ONDANSETRON 4 MG PO TBDP: 4.0000 mg | ORAL_TABLET | Freq: Three times a day (TID) | ORAL | Status: DC | PRN

## 2017-12-28 MED ORDER — ACETAMINOPHEN 500 MG PO TABS
1000.0000 mg | ORAL_TABLET | Freq: Once | ORAL | Status: AC
  Administered 2017-12-28: 1000 mg via ORAL
  Filled 2017-12-28: qty 2

## 2017-12-28 NOTE — Discharge Instructions (Addendum)
May take Tylenol for pain.  Please continue to monitor your baby's activities and seek immediate attention if you notice a decrease or no baby movement.  Return to the emergency department if you develop a gush of fluid, vaginal bleeding, severe pain, lightheadedness or fainting, or any other symptoms concerning to you.

## 2017-12-28 NOTE — ED Notes (Signed)
Per Lanice Schwab, RN, Pt will be service, [facility], Dr. Dalbert Garnet, to OBS 3

## 2017-12-28 NOTE — ED Provider Notes (Signed)
[facility] Emergency Department Provider Note  ____________________________________________  Time seen: Approximately 9:00 AM  I have reviewed the triage vital signs and the nursing notes.   HISTORY  Chief Complaint Fall and Pregnant patient    HPI Ruth Gross is a 22 y.o. female G12, P2 [redacted] weeks pregnant presenting for abdominal pain after fall.  The patient reports that earlier this morning, she "missed a step" and fell down approximately 5 or 6 stairs, landing on her abdomen.  She immediately had pain and one episode of nausea and vomiting which have resolved.  She continues to have some diffuse nonfocal pain.  She has not tried anything for her pain.  She has not had any gush of fluid or vaginal bleeding, continues to have baseline normal feeling Braxton Hicks contractions, but has not noted any fetal movement since the fall.  Fetal heart tones were assessed by me in the emergency department at approximately 150 bpm.  Past Medical History:  Diagnosis Date  . Anemia   . Asthma   . Headache   . Heart problem    Pt states she had a "heart attack" in September.  [facility] ED note indicated discharged after normal testing with flexeril and naproxen.    Marland Kitchen Hx of chlamydia infection   . Hx of pyelonephritis during pregnancy 04/2016  . Pyelonephritis affecting pregnancy   . Vertigo     Patient Active Problem List   Diagnosis Date Noted  . Status post repeat low transverse cesarean section 09/02/2016  . Pyelonephritis 05/09/2016  . Supervision of other normal pregnancy 02/11/2013  . Pregnancy 02/09/2013  . Anemia 06/16/2012  . History of miscarriage 06/16/2012    Past Surgical History:  Procedure Laterality Date  . CESAREAN SECTION N/A 02/10/2013   Procedure: CESAREAN SECTION;  Surgeon: Levi Aland, MD;  Location: [facility] OR[facility];  Service: Obstetrics;  Laterality: N/A;  . CESAREAN SECTION N/A 09/02/2016   Procedure: CESAREAN SECTION;  Surgeon: Essie Hart, MD;   Location: [facility] BIRTHING SUITES;  Service: Obstetrics;  Laterality: N/A;  . DILATION AND EVACUATION  04/10/2012   Procedure: DILATATION AND EVACUATION;  Surgeon: Mickel Baas, MD;  Location: [facility] OR[facility];  Service: Gynecology;  Laterality: N/A;  . DILATION AND EVACUATION N/A 06/29/2012   Procedure: DILATATION AND EVACUATION;  Surgeon: Mickel Baas, MD;  Location: [facility] ORS;  Service: Gynecology;  Laterality: N/A;    Current Outpatient Rx  . Order #: [id number] Class: Print  . Order #: [id number] Class: Historical Med  . Order #: [id number] Class: Print  . Order #: [id number] Class: Print  . Order #: [id number] Class: Print    Allergies Patient has no known allergies.  Family History  Problem Relation Age of Onset  . Diabetes Father   . Diabetes Sister   . Heart disease Paternal Uncle   . Heart disease Paternal Grandmother   . Other Neg Hx     Social History Social History   Tobacco Use  . Smoking status: Never Smoker  . Smokeless tobacco: Never Used  Substance Use Topics  . Alcohol use: No  . Drug use: No    Review of Systems Constitutional: No fever/chills.  Positive fall.  No loss of consciousness.  Did not hit her head. Eyes: No visual changes.  No blurred or double vision. ENT: No sore throat. No congestion or rhinorrhea. Cardiovascular: Denies chest pain. Denies palpitations. Respiratory: Denies shortness of breath.  No cough. Gastrointestinal: Positive diffuse nonfocal abdominal pain.  One episode of nausea and  vomiting, now resolved..  No diarrhea.  No constipation. Genitourinary: Negative for dysuria.  No gush of fluid, vaginal bleeding.  Positive decreased fetal movement. Musculoskeletal: Negative for back pain.  Negative for neck pain. Skin: Negative for rash. Neurological: Negative for headaches. No focal numbness, tingling or weakness.     ____________________________________________   PHYSICAL EXAM:  VITAL SIGNS: ED Triage Vitals  Enc Vitals Group     BP  12/28/17 0819 118/70     Pulse Rate 12/28/17 0819 80     Resp 12/28/17 0819 18     Temp 12/28/17 0832 97.6 F (36.4 C)     Temp Source 12/28/17 0819 Oral     SpO2 12/28/17 0819 100 %     Weight 12/28/17 0821 170 lb (77.1 kg)     Height 12/28/17 0821 5\' 4"  (1.626 m)     Head Circumference --      Peak Flow --      Pain Score 12/28/17 0820 8     Pain Loc --      Pain Edu? --      Excl. in GC? --     Constitutional: Alert and oriented. Answers questions appropriately.  GCS is 15. Eyes: Conjunctivae are normal.  EOMI. No scleral icterus.  No raccoon eyes. Head: Atraumatic.  No battle sign. Nose: No congestion/rhinnorhea.  No swelling over the nose or septal hematoma. Mouth/Throat: Mucous membranes are moist.  No dental injury or malocclusion. Neck: No stridor.  Supple.  No midline C-spine tenderness to palpation, step-offs or deformities.  Full range of motion without pain. Cardiovascular: Normal rate, regular rhythm. No murmurs, rubs or gallops.  Respiratory: Normal respiratory effort.  No accessory muscle use or retractions. Lungs CTAB.  No wheezes, rales or ronchi. Gastrointestinal: Soft, and nondistended.  The patient has a gravid uterus to just below the thoracic cage with mild diffuse tenderness palpation in all 4 quadrants without focality.  I performed fetal heart tones, as did the nurse, with fetal heart tones in the 150s.  The patient has 1 Braxton Hicks contraction lasting approximately 20 seconds and self resolving on my examination.  There is no overlying bruising, or skin changes, on the abdomen.  No guarding or rebound.  No peritoneal signs. Genitourinary: Deferred as the patient is asymptomatic. Musculoskeletal: No LE edema. No ttp in the calves or palpable cords.  Negative Homan's sign. Neurologic:  A&Ox3.  Speech is clear.  Face and smile are symmetric.  EOMI.  Moves all extremities well. Skin:  Skin is warm, dry and intact. No rash noted. Psychiatric: Mood and affect are  normal. Speech and behavior are normal.  Normal judgement  ____________________________________________   LABS (all labs ordered are listed, but only abnormal results are displayed)  Labs Reviewed - No data to display ____________________________________________  EKG  Not indicated ____________________________________________  RADIOLOGY  No results found.  ____________________________________________   PROCEDURES  Procedure(s) performed: None  Procedures  Critical Care performed: No ____________________________________________   INITIAL IMPRESSION / ASSESSMENT AND PLAN / ED COURSE  Pertinent labs & imaging results that were available during my care of the patient were reviewed by me and considered in my medical decision making (see chart for details).  22 y.o. G4, P2 approximately 36-week pregnant female presenting with diffuse abdominal pain after a fall.  Overall, the patient is hemodynamically stable and has a reassuring examination, normal fetal heart tones for her gestational age.  She is not having any red flag symptoms including gush of  fluid, vaginal bleeding, or sustained uterine contractions.  Her abdominal exam is nonfocal although she does have diffuse tenderness.  The likelihood of severe intra-abdominal injury or fetal injury is low at this time, but I do think she warrants fetal heart monitoring and we will send her to labor and delivery for additional work-up.  I did talk to the patient about CT imaging for evaluation of abdominal trauma, and recommended deferral of this type of testing; the patient agreed with the plan and overall is feeling reassured.  She follows with an OB/GYN in [place], and understands that based on likely discharge from here, she will need to follow-up for reevaluation.  Return precautions were discussed.  ____________________________________________  FINAL CLINICAL IMPRESSION(S) / ED DIAGNOSES  Final diagnoses:  Fall, initial  encounter  Generalized abdominal pain         NEW MEDICATIONS STARTED DURING THIS VISIT:  New Prescriptions   No medications on file      Rockne Menghini, MD 12/28/17 [phone]

## 2017-12-28 NOTE — OB Triage Note (Signed)
Pt. fell at approximately 7:30a while walking down the front steps of her house.  She believes she just missed a step, falling a total of approximately 6 steps and hitting her front abdominal area.  Denies leaking of fluid or vaginal bleeding; positive for fetal movement.  States pain is 7/10, radiating from knees up to chest, centering in abdominal area and is achy in consistency.  Sees Dr. Dareen Piano at [facility] in [place], but lives in [place].

## 2017-12-28 NOTE — Progress Notes (Signed)
Pt. to Ultrasound via wheelchair with orderly.

## 2017-12-28 NOTE — ED Triage Notes (Signed)
Pt states she was walking down her steps this morning and slipped and fell forward. Pt c/o abd pain with no fetal movement, "hurting all over" pt is [redacted] weeks pregnant. Denies any vaginal bleeding.

## 2017-12-28 NOTE — ED Notes (Addendum)
Fetal HR 156 via doppler. Pt states she is not feeling baby move since the fall. Pt reports baby is generally very active. Pt alert & oriented with NAD noted.

## 2017-12-29 NOTE — Discharge Summary (Signed)
    L&D OB Triage Note  SUBJECTIVE Ruth Gross is a 22 y.o. [id number] female at [redacted]w[redacted]d, EDD Estimated Date of Delivery: 01/27/18 who presented to triage with complaints of falling down some steps.  Reports no bleeding, reports active fetal movement.  OB History  Gravida Para Term Preterm AB Living  4 2 2  0 1 2  SAB TAB Ectopic Multiple Live Births  1 0 0 0 2    # Outcome Date GA Lbr Len/2nd Weight Sex Delivery Anes PTL Lv  4 Current           3 Term 09/02/16 [redacted]w[redacted]d  3415 g M CS-Vac Spinal  LIV     Name: Vernon,BOY Elea     Apgar1: 8  Apgar5: 9  2 Term 02/10/13 [redacted]w[redacted]d  3340 g F CS-LTranv EPI  LIV     Name: Arora,GIRL Ruchama     Apgar1: 8  Apgar5: 9  1 SAB 04/09/12            No medications prior to admission.     OBJECTIVE  Nursing Evaluation:   BP 103/72 (BP Location: Left Arm)   Pulse (!) 106   Temp 98.4 F (36.9 C) (Oral)   Resp 16   Ht 5\' 4"  (1.626 m)   Wt 77.1 kg   LMP 05/22/2017 (Exact Date)   SpO2 100%   BMI 29.18 kg/m    Findings:   No contractions, no bleeding noted, ultrasound reveals no evidence of abruption or other abnormality.  NST was performed and has been reviewed by me.  NST INTERPRETATION: Category I  Mode: External Baseline Rate (A): 130 bpm Variability: Moderate Accelerations: 15 x 15 Decelerations: None     Contraction Frequency (min): UI  ASSESSMENT Impression:  1.  Pregnancy:  [id number] at [redacted]w[redacted]d , EDD Estimated Date of Delivery: 01/27/18 2.  NST:  Category I  PLAN 1. Reassurance given 2. Discharge home with standard labor precautions given to return to L&D or call the office for problems. 3. Continue routine prenatal care.

## 2018-01-05 ENCOUNTER — Telehealth (HOSPITAL_COMMUNITY): Payer: Self-pay | Admitting: *Deleted

## 2018-01-05 NOTE — Telephone Encounter (Signed)
Preadmission screen  

## 2018-01-06 ENCOUNTER — Encounter (HOSPITAL_COMMUNITY): Payer: Self-pay

## 2018-01-14 ENCOUNTER — Other Ambulatory Visit: Payer: Self-pay

## 2018-01-14 ENCOUNTER — Inpatient Hospital Stay (HOSPITAL_COMMUNITY)
Admission: AD | Admit: 2018-01-14 | Discharge: 2018-01-16 | DRG: 788 | Disposition: A | Discharge status: Home / Self Care | Payer: Medicaid Other | Attending: Obstetrics | Admitting: Obstetrics

## 2018-01-14 ENCOUNTER — Inpatient Hospital Stay (HOSPITAL_COMMUNITY): Payer: Medicaid Other | Admitting: Anesthesiology

## 2018-01-14 ENCOUNTER — Encounter (HOSPITAL_COMMUNITY): Payer: Self-pay | Admitting: *Deleted

## 2018-01-14 ENCOUNTER — Encounter (HOSPITAL_COMMUNITY)
Admission: AD | Disposition: A | Discharge status: Home / Self Care | Payer: Self-pay | Source: Home / Self Care | Attending: Obstetrics

## 2018-01-14 DIAGNOSIS — Z3483 Encounter for supervision of other normal pregnancy, third trimester: Secondary | ICD-10-CM | POA: Diagnosis present

## 2018-01-14 DIAGNOSIS — O9902 Anemia complicating childbirth: Secondary | ICD-10-CM | POA: Diagnosis present

## 2018-01-14 DIAGNOSIS — D649 Anemia, unspecified: Secondary | ICD-10-CM | POA: Diagnosis present

## 2018-01-14 DIAGNOSIS — O99344 Other mental disorders complicating childbirth: Secondary | ICD-10-CM | POA: Diagnosis present

## 2018-01-14 DIAGNOSIS — F329 Major depressive disorder, single episode, unspecified: Secondary | ICD-10-CM | POA: Diagnosis present

## 2018-01-14 DIAGNOSIS — O34211 Maternal care for low transverse scar from previous cesarean delivery: Secondary | ICD-10-CM | POA: Diagnosis present

## 2018-01-14 DIAGNOSIS — Z98891 History of uterine scar from previous surgery: Secondary | ICD-10-CM

## 2018-01-14 DIAGNOSIS — Z3A38 38 weeks gestation of pregnancy: Secondary | ICD-10-CM

## 2018-01-14 DIAGNOSIS — Z23 Encounter for immunization: Secondary | ICD-10-CM

## 2018-01-14 LAB — CBC
HCT: 35.3 % — ABNORMAL LOW (ref 36.0–46.0)
Hemoglobin: 11.2 g/dL — ABNORMAL LOW (ref 12.0–15.0)
MCH: 23.9 pg — ABNORMAL LOW (ref 26.0–34.0)
MCHC: 31.7 g/dL (ref 30.0–36.0)
MCV: 75.4 fL — ABNORMAL LOW (ref 80.0–100.0)
PLATELETS: 344 10*3/uL (ref 150–400)
RBC: 4.68 MIL/uL (ref 3.87–5.11)
RDW: 14.3 % (ref 11.5–15.5)
WBC: 14.6 10*3/uL — AB (ref 4.0–10.5)

## 2018-01-14 LAB — TYPE AND SCREEN
ABO/RH(D): O POS
ANTIBODY SCREEN: NEGATIVE

## 2018-01-14 LAB — RPR: RPR: NONREACTIVE

## 2018-01-14 SURGERY — Surgical Case
Anesthesia: Spinal

## 2018-01-14 MED ORDER — FENTANYL CITRATE (PF) 100 MCG/2ML IJ SOLN
25.0000 ug | INTRAMUSCULAR | Status: DC | PRN
  Administered 2018-01-14: 25 ug via INTRAVENOUS

## 2018-01-14 MED ORDER — MORPHINE SULFATE (PF) 0.5 MG/ML IJ SOLN
INTRAMUSCULAR | Status: AC
  Filled 2018-01-14: qty 10

## 2018-01-14 MED ORDER — OXYTOCIN 40 UNITS IN LACTATED RINGERS INFUSION - SIMPLE MED: 2.5000 [IU]/h | INTRAVENOUS | Status: AC

## 2018-01-14 MED ORDER — NALBUPHINE HCL 10 MG/ML IJ SOLN
INTRAMUSCULAR | Status: AC
  Filled 2018-01-14: qty 1

## 2018-01-14 MED ORDER — FLUOXETINE HCL 20 MG PO CAPS
40.0000 mg | ORAL_CAPSULE | Freq: Every day | ORAL | Status: DC
  Administered 2018-01-15 – 2018-01-16 (×2): 40 mg via ORAL
  Filled 2018-01-14 (×3): qty 2

## 2018-01-14 MED ORDER — KETOROLAC TROMETHAMINE 30 MG/ML IJ SOLN
INTRAMUSCULAR | Status: AC
  Filled 2018-01-14: qty 1

## 2018-01-14 MED ORDER — SIMETHICONE 80 MG PO CHEW
80.0000 mg | CHEWABLE_TABLET | Freq: Three times a day (TID) | ORAL | Status: DC
  Administered 2018-01-14 – 2018-01-16 (×4): 80 mg via ORAL
  Filled 2018-01-14 (×4): qty 1

## 2018-01-14 MED ORDER — FENTANYL CITRATE (PF) 100 MCG/2ML IJ SOLN
INTRAMUSCULAR | Status: DC | PRN
  Administered 2018-01-14: 15 ug via INTRATHECAL

## 2018-01-14 MED ORDER — SOD CITRATE-CITRIC ACID 500-334 MG/5ML PO SOLN
30.0000 mL | Freq: Once | ORAL | Status: AC
  Administered 2018-01-14: 30 mL via ORAL
  Filled 2018-01-14: qty 15

## 2018-01-14 MED ORDER — WITCH HAZEL-GLYCERIN EX PADS: 1.0000 "application " | MEDICATED_PAD | CUTANEOUS | Status: DC | PRN

## 2018-01-14 MED ORDER — SODIUM CHLORIDE 0.9 % IR SOLN
Status: DC | PRN
  Administered 2018-01-14: 1

## 2018-01-14 MED ORDER — DIPHENHYDRAMINE HCL 25 MG PO CAPS
25.0000 mg | ORAL_CAPSULE | Freq: Four times a day (QID) | ORAL | Status: DC | PRN
  Filled 2018-01-14: qty 1

## 2018-01-14 MED ORDER — NALBUPHINE HCL 10 MG/ML IJ SOLN: 5.0000 mg | Freq: Once | INTRAMUSCULAR | Status: AC | PRN

## 2018-01-14 MED ORDER — COCONUT OIL OIL: 1.0000 "application " | TOPICAL_OIL | Status: DC | PRN

## 2018-01-14 MED ORDER — MENTHOL 3 MG MT LOZG: 1.0000 | LOZENGE | OROMUCOSAL | Status: DC | PRN

## 2018-01-14 MED ORDER — ONDANSETRON HCL 4 MG/2ML IJ SOLN: 4.0000 mg | Freq: Three times a day (TID) | INTRAMUSCULAR | Status: DC | PRN

## 2018-01-14 MED ORDER — NALBUPHINE HCL 10 MG/ML IJ SOLN
5.0000 mg | Freq: Once | INTRAMUSCULAR | Status: AC | PRN
  Administered 2018-01-14: 5 mg via INTRAVENOUS

## 2018-01-14 MED ORDER — DIBUCAINE 1 % RE OINT: 1.0000 "application " | TOPICAL_OINTMENT | RECTAL | Status: DC | PRN

## 2018-01-14 MED ORDER — CEFAZOLIN SODIUM-DEXTROSE 2-4 GM/100ML-% IV SOLN
2.0000 g | INTRAVENOUS | Status: AC
  Administered 2018-01-14: 2 g via INTRAVENOUS
  Filled 2018-01-14: qty 100

## 2018-01-14 MED ORDER — KETOROLAC TROMETHAMINE 30 MG/ML IJ SOLN: 30.0000 mg | Freq: Four times a day (QID) | INTRAMUSCULAR | Status: AC | PRN

## 2018-01-14 MED ORDER — SENNOSIDES-DOCUSATE SODIUM 8.6-50 MG PO TABS
2.0000 | ORAL_TABLET | ORAL | Status: DC
  Administered 2018-01-15 – 2018-01-16 (×2): 2 via ORAL
  Filled 2018-01-14 (×4): qty 2

## 2018-01-14 MED ORDER — LACTATED RINGERS IV SOLN
INTRAVENOUS | Status: DC | PRN
  Administered 2018-01-14: 10:00:00 via INTRAVENOUS

## 2018-01-14 MED ORDER — ONDANSETRON HCL 4 MG/2ML IJ SOLN
INTRAMUSCULAR | Status: DC | PRN
  Administered 2018-01-14 (×2): 4 mg via INTRAVENOUS

## 2018-01-14 MED ORDER — PRENATAL MULTIVITAMIN CH
1.0000 | ORAL_TABLET | Freq: Every day | ORAL | Status: DC
  Administered 2018-01-15 – 2018-01-16 (×2): 1 via ORAL
  Filled 2018-01-14 (×2): qty 1

## 2018-01-14 MED ORDER — FENTANYL CITRATE (PF) 100 MCG/2ML IJ SOLN
INTRAMUSCULAR | Status: AC
  Administered 2018-01-14: 50 ug via INTRAVENOUS
  Filled 2018-01-14: qty 2

## 2018-01-14 MED ORDER — DIPHENHYDRAMINE HCL 50 MG/ML IJ SOLN: 12.5000 mg | INTRAMUSCULAR | Status: DC | PRN

## 2018-01-14 MED ORDER — OXYTOCIN 10 UNIT/ML IJ SOLN
INTRAVENOUS | Status: DC | PRN
  Administered 2018-01-14: 40 [IU] via INTRAVENOUS

## 2018-01-14 MED ORDER — LACTATED RINGERS IV SOLN
INTRAVENOUS | Status: DC
  Administered 2018-01-14 (×2): via INTRAVENOUS

## 2018-01-14 MED ORDER — OXYCODONE HCL 5 MG PO TABS
10.0000 mg | ORAL_TABLET | ORAL | Status: DC | PRN
  Administered 2018-01-14 – 2018-01-16 (×5): 10 mg via ORAL
  Filled 2018-01-14 (×5): qty 2

## 2018-01-14 MED ORDER — OXYCODONE HCL 5 MG PO TABS
5.0000 mg | ORAL_TABLET | ORAL | Status: DC | PRN
  Administered 2018-01-15: 5 mg via ORAL
  Filled 2018-01-14: qty 1

## 2018-01-14 MED ORDER — MORPHINE SULFATE (PF) 0.5 MG/ML IJ SOLN
INTRAMUSCULAR | Status: DC | PRN
  Administered 2018-01-14: .15 mg via INTRATHECAL

## 2018-01-14 MED ORDER — FENTANYL CITRATE (PF) 100 MCG/2ML IJ SOLN
INTRAMUSCULAR | Status: AC
  Filled 2018-01-14: qty 2

## 2018-01-14 MED ORDER — NALOXONE HCL 0.4 MG/ML IJ SOLN: 0.4000 mg | INTRAMUSCULAR | Status: DC | PRN

## 2018-01-14 MED ORDER — SIMETHICONE 80 MG PO CHEW: 80.0000 mg | CHEWABLE_TABLET | ORAL | Status: DC | PRN

## 2018-01-14 MED ORDER — LACTATED RINGERS IV BOLUS
1000.0000 mL | Freq: Once | INTRAVENOUS | Status: AC
  Administered 2018-01-14: 1000 mL via INTRAVENOUS

## 2018-01-14 MED ORDER — LACTATED RINGERS IV SOLN
INTRAVENOUS | Status: DC
  Administered 2018-01-15: 01:00:00 via INTRAVENOUS

## 2018-01-14 MED ORDER — TETANUS-DIPHTH-ACELL PERTUSSIS 5-2.5-18.5 LF-MCG/0.5 IM SUSP: 0.5000 mL | Freq: Once | INTRAMUSCULAR | Status: AC

## 2018-01-14 MED ORDER — MEPERIDINE HCL 25 MG/ML IJ SOLN: 6.2500 mg | INTRAMUSCULAR | Status: DC | PRN

## 2018-01-14 MED ORDER — KETOROLAC TROMETHAMINE 30 MG/ML IJ SOLN
30.0000 mg | Freq: Four times a day (QID) | INTRAMUSCULAR | Status: AC | PRN
  Administered 2018-01-14: 30 mg via INTRAMUSCULAR

## 2018-01-14 MED ORDER — SIMETHICONE 80 MG PO CHEW
80.0000 mg | CHEWABLE_TABLET | ORAL | Status: DC
  Administered 2018-01-15 – 2018-01-16 (×2): 80 mg via ORAL
  Filled 2018-01-14 (×2): qty 1

## 2018-01-14 MED ORDER — OXYTOCIN 10 UNIT/ML IJ SOLN
INTRAMUSCULAR | Status: AC
  Filled 2018-01-14: qty 4

## 2018-01-14 MED ORDER — ONDANSETRON HCL 4 MG/2ML IJ SOLN
INTRAMUSCULAR | Status: AC
  Filled 2018-01-14: qty 2

## 2018-01-14 MED ORDER — PHENYLEPHRINE 8 MG IN D5W 100 ML (0.08MG/ML) PREMIX OPTIME
INJECTION | INTRAVENOUS | Status: DC | PRN
  Administered 2018-01-14: 60 ug/min via INTRAVENOUS

## 2018-01-14 MED ORDER — DIPHENHYDRAMINE HCL 25 MG PO CAPS
25.0000 mg | ORAL_CAPSULE | ORAL | Status: DC | PRN
  Administered 2018-01-15: 25 mg via ORAL
  Filled 2018-01-14 (×2): qty 1

## 2018-01-14 MED ORDER — INFLUENZA VAC SPLIT QUAD 0.5 ML IM SUSY
0.5000 mL | PREFILLED_SYRINGE | INTRAMUSCULAR | Status: AC
  Administered 2018-01-16: 0.5 mL via INTRAMUSCULAR
  Filled 2018-01-14: qty 0.5

## 2018-01-14 MED ORDER — SODIUM CHLORIDE 0.9% FLUSH: 3.0000 mL | INTRAVENOUS | Status: DC | PRN

## 2018-01-14 MED ORDER — IBUPROFEN 600 MG PO TABS
600.0000 mg | ORAL_TABLET | Freq: Four times a day (QID) | ORAL | Status: DC
  Administered 2018-01-14 – 2018-01-16 (×8): 600 mg via ORAL
  Filled 2018-01-14 (×8): qty 1

## 2018-01-14 MED ORDER — SCOPOLAMINE 1 MG/3DAYS TD PT72
1.0000 | MEDICATED_PATCH | TRANSDERMAL | Status: DC
  Administered 2018-01-14: 1.5 mg via TRANSDERMAL
  Filled 2018-01-14: qty 1

## 2018-01-14 MED ORDER — NALBUPHINE HCL 10 MG/ML IJ SOLN: 5.0000 mg | INTRAMUSCULAR | Status: DC | PRN

## 2018-01-14 MED ORDER — FAMOTIDINE IN NACL 20-0.9 MG/50ML-% IV SOLN
20.0000 mg | Freq: Once | INTRAVENOUS | Status: AC
  Administered 2018-01-14: 20 mg via INTRAVENOUS
  Filled 2018-01-14: qty 50

## 2018-01-14 MED ORDER — FENTANYL CITRATE (PF) 100 MCG/2ML IJ SOLN
50.0000 ug | Freq: Once | INTRAMUSCULAR | Status: AC
  Administered 2018-01-14: 50 ug via INTRAVENOUS
  Filled 2018-01-14: qty 2

## 2018-01-14 MED ORDER — NALOXONE HCL 4 MG/10ML IJ SOLN
1.0000 ug/kg/h | INTRAVENOUS | Status: DC | PRN
  Filled 2018-01-14: qty 5

## 2018-01-14 MED ORDER — ACETAMINOPHEN 325 MG PO TABS
650.0000 mg | ORAL_TABLET | ORAL | Status: DC | PRN
  Administered 2018-01-14 – 2018-01-16 (×5): 650 mg via ORAL
  Filled 2018-01-14 (×5): qty 2

## 2018-01-14 MED ORDER — BUPIVACAINE IN DEXTROSE 0.75-8.25 % IT SOLN
INTRATHECAL | Status: DC | PRN
  Administered 2018-01-14: 1.6 mL via INTRATHECAL

## 2018-01-14 SURGICAL SUPPLY — 25 items
BENZOIN TINCTURE PRP APPL 2/3 (GAUZE/BANDAGES/DRESSINGS) ×2 IMPLANT
CHLORAPREP W/TINT 26ML (MISCELLANEOUS) ×2 IMPLANT
CLAMP CORD UMBIL (MISCELLANEOUS) IMPLANT
CLOSURE STERI STRIP 1/2 X4 (GAUZE/BANDAGES/DRESSINGS) ×2 IMPLANT
CLOTH BEACON ORANGE TIMEOUT ST (SAFETY) ×2 IMPLANT
DRSG OPSITE POSTOP 4X10 (GAUZE/BANDAGES/DRESSINGS) ×2 IMPLANT
ELECT REM PT RETURN 9FT ADLT (ELECTROSURGICAL) ×2
ELECTRODE REM PT RTRN 9FT ADLT (ELECTROSURGICAL) ×1 IMPLANT
EXTRACTOR VACUUM M CUP 4 TUBE (SUCTIONS) IMPLANT
GLOVE BIOGEL PI IND STRL 7.0 (GLOVE) ×1 IMPLANT
GLOVE BIOGEL PI INDICATOR 7.0 (GLOVE) ×1
GLOVE ECLIPSE 7.0 STRL STRAW (GLOVE) ×4 IMPLANT
GOWN STRL REUS W/TWL LRG LVL3 (GOWN DISPOSABLE) ×4 IMPLANT
KIT ABG SYR 3ML LUER SLIP (SYRINGE) IMPLANT
NEEDLE HYPO 25X5/8 SAFETYGLIDE (NEEDLE) IMPLANT
NS IRRIG 1000ML POUR BTL (IV SOLUTION) ×2 IMPLANT
PACK C SECTION WH (CUSTOM PROCEDURE TRAY) ×2 IMPLANT
PAD OB MATERNITY 4.3X12.25 (PERSONAL CARE ITEMS) ×2 IMPLANT
SPONGE LAP 18X18 RF (DISPOSABLE) ×6 IMPLANT
SUT MNCRL 0 VIOLET CTX 36 (SUTURE) ×3 IMPLANT
SUT MON AB 2-0 CT1 27 (SUTURE) ×4 IMPLANT
SUT MONOCRYL 0 CTX 36 (SUTURE) ×3
SUT PLAIN 0 NONE (SUTURE) IMPLANT
TOWEL OR 17X24 6PK STRL BLUE (TOWEL DISPOSABLE) ×2 IMPLANT
TRAY FOLEY W/BAG SLVR 14FR LF (SET/KITS/TRAYS/PACK) IMPLANT

## 2018-01-14 NOTE — Anesthesia Postprocedure Evaluation (Signed)
Anesthesia Post Note  Patient: Ruth Gross  Procedure(s) Performed: REPEAT CESAREAN SECTION (N/A )     Patient location during evaluation: Mother Baby Anesthesia Type: Spinal Level of consciousness: awake Pain management: satisfactory to patient Vital Signs Assessment: post-procedure vital signs reviewed and stable Respiratory status: spontaneous breathing Cardiovascular status: stable Anesthetic complications: no    Last Vitals:  Vitals:   01/14/18 1450 01/14/18 1615  BP: 105/66 (!) 101/54  Pulse: (!) 59 65  Resp:    Temp: (!) 36.1 C 36.7 C  SpO2: 98% 99%    Last Pain:  Vitals:   01/14/18 1615  TempSrc: Oral  PainSc: 7    Pain Goal:                 [employer]

## 2018-01-14 NOTE — MAU Note (Signed)
Talked with [facility] OR Coordinator, planning for 0930. Central nursery, [employer], anes-Dr [employer].

## 2018-01-14 NOTE — Lactation Note (Signed)
This note was copied from a baby's chart. Lactation Consultation Note  Patient Name: Ruth Gross [id number] Date: 01/14/2018 Reason for consult: Initial assessment;Early term 2-38.6wks  4 hours old early term female who is being exclusively formula feeding at this point, mother is a P3, she voiced she plans to pump and bottle though because she won't have enough time to BF this baby; she already has 2 more at home, she was able to BF her previous kids for 4 months each.   Mom doesn't have a pump at home, she participated in the [facility] program at [facility] and plans to get her pump there. LC offered a hand pump from the hospital to take home in the meantime, she also set up a DEBP, instructions, cleaning and storage for both were reviewed, as well as milk storage guidelines.  Baby is currently on Gerber gentle, reviewed supplementation guidelines for babies under 24 hours with both parents and the size of baby's stomach.  Feeding plan:  1. Mom will start pumping tomorrow morning, she's not ready to start right now but has everything set up in her room to do so 2. She'll call for assistance if needed of if she plans taking baby to the breast while in the hospital  BF brochure, BF resources and feeding diary were reviewed. Parents reported all questions and concerns were answered, they're both aware of LC services and will call PRN.    Maternal Data Formula Feeding for Exclusion: Yes Reason for exclusion: Mother's choice to formula and breast feed on admission Has patient been taught Hand Expression?: Yes Does the patient have breastfeeding experience prior to this delivery?: Yes  Feeding Feeding Type: Bottle Fed - Formula Nipple Type: Slow - flow    Interventions Interventions: Breast feeding basics reviewed;Hand pump;DEBP  Lactation Tools Discussed/Used Tools: Pump Breast pump type: Double-Electric Breast Pump;Manual WIC Program: Yes Pump Review: Setup, frequency, and  cleaning;Milk Storage Initiated by:: MPeck Date initiated:: 01/14/18   Consult Status Consult Status: Follow-up Date: 01/15/18 Follow-up type: In-patient    Evangelene Vora Venetia Constable 01/14/2018, 2:42 PM

## 2018-01-14 NOTE — H&P (Signed)
22 y.o. [id number] @ [redacted]w[redacted]d presents with regular painful contractions.  In MAU, she is contracting q2-4 minutes and has changed her cervix from closed to 1.5 cm.  She has a history of 2 prior cesarean sections.  Otherwise has good fetal movement and no bleeding.  Pregnancy c/b: 1. Late prenatal care.  Presented at 25 weeks.  Reported an early [place] at a pregnancy center at 7-8 weeks, but we were unable to obtain these records for verification.  2.  Depression: on prozac 40 mg daily, inconsistent use in last week  Past Medical History:  Diagnosis Date  . Anemia   . Asthma   . Headache   . Heart problem    Pt states she had a "heart attack" in September.  [facility] ED note indicated discharged after normal testing with flexeril and naproxen.    Marland Kitchen Hx of chlamydia infection   . Hx of pyelonephritis during pregnancy 04/2016  . Pyelonephritis affecting pregnancy   . Vertigo     Past Surgical History:  Procedure Laterality Date  . CESAREAN SECTION N/A 02/10/2013   Procedure: CESAREAN SECTION;  Surgeon: Levi Aland, MD;  Location: [facility] ORS;  Service: Obstetrics;  Laterality: N/A;  . CESAREAN SECTION N/A 09/02/2016   Procedure: CESAREAN SECTION;  Surgeon: Essie Hart, MD;  Location: [facility]IRTHING SUIT[facility];  Service: Obstetrics;  Laterality: N/A;  . DILATION AND EVACUATION  04/10/2012   Procedure: DILATATION AND EVACUATION;  Surgeon: Mickel Baas, MD;  Location: [facility] ORS;  Service: Gynecology;  Laterality: N/A;  . DILATION AND EVACUATION N/A 06/29/2012   Procedure: DILATATION AND EVACUATION;  Surgeon: Mickel Baas, MD;  Location: [facility];  Service: Gynecology;  Laterality: N/A;    OB History  Gravida Para Term Preterm AB Living  4 2 2   1 2   SAB TAB Ectopic Multiple Live Births  1     0 2    # Outcome Date GA Lbr Len/2nd Weight Sex Delivery Anes PTL Lv  4 Current           3 Term 09/02/16 [redacted]w[redacted]d  3415 g M CS-Vac Spinal  LIV  2 Term 02/10/13 [redacted]w[redacted]d  3340 g F CS-LTranv EPI  LIV  1 SAB 04/09/12             Social History   Socioeconomic History  . Marital status: Single    Spouse name: Not on file  . Number of children: Not on file  . Years of education: Not on file  . Highest education level: Not on file  Occupational History  . Not on file  Social Needs  . Financial resource strain: Not hard at all  . Food insecurity:    Worry: Never true    Inability: Never true  . Transportation needs:    Medical: No    Non-medical: Not on file  Tobacco Use  . Smoking status: Never Smoker  . Smokeless tobacco: Never Used  Substance and Sexual Activity  . Alcohol use: No  . Drug use: No  . Sexual activity: Yes    Comment: pregnant   Patient has no active allergies.    Prenatal Transfer Tool  Maternal Diabetes: No Genetic Screening: Normal Maternal Ultrasounds/Referrals: Normal Fetal Ultrasounds or other Referrals:  None Maternal Substance Abuse:  No Significant Maternal Medications:  Meds include: Prozac Significant Maternal Lab Results: None  ABO, Rh: O/Positive/-- (08/06 0000) Antibody: n (08/06 0000) Rubella: Immune (08/06 0000) RPR: Nonreactive (08/06 0000)  HBsAg: Negative (08/06  0000)  HIV: Non-reactive (08/06 0000)  GBS:   Unknown--results still pending      Vitals:   01/14/18 0618  BP: 114/70  Pulse: 86  Resp: 19  Temp: 98.4 F (36.9 C)     General:  NAD Abdomen:  soft, gravid Ex:  1+ edema SVE: 1-2/50 per RN FHTs:  130s, mod var, + accels Toco: q3 min    A/P   22 y.o. [id number] [redacted]w[redacted]d presents with labor History of cesarean section x 2--given regular painful contractions and cervical change from office, will proceed with repeat c/s for labor at this time  Discussed risks of cesarean section to include, but not limited to, infection, bleeding, damage to surrounding strutcures (including bowel, bladder, tubes, ovaries, nerves, vessels, baby), need for additional procedures, risk of blood clot, need for transfusion. Consent signed Ancef 2gm on call to  OR  [facility] GEFFEL Chestine Spore

## 2018-01-14 NOTE — Anesthesia Preprocedure Evaluation (Signed)
Anesthesia Evaluation  Patient identified by MRN, date of birth, ID band Patient awake    Reviewed: Allergy & Precautions, NPO status , Patient's Chart, lab work & pertinent test results  Airway Mallampati: II  TM Distance: >3 FB Neck ROM: Full    Dental no notable dental hx. (+) Teeth Intact   Pulmonary asthma ,    Pulmonary exam normal breath sounds clear to auscultation- rhonchi       Cardiovascular Normal cardiovascular exam Rhythm:Regular Rate:Normal     Neuro/Psych  Headaches, Depression    GI/Hepatic Neg liver ROS, GERD  Medicated,  Endo/Other  negative endocrine ROS  Renal/GU negative Renal ROS  negative genitourinary   Musculoskeletal negative musculoskeletal ROS (+)   Abdominal   Peds  Hematology  (+) anemia ,   Anesthesia Other Findings   Reproductive/Obstetrics (+) Pregnancy Previous C/Section x 2 SROM                             Anesthesia Physical Anesthesia Plan  ASA: II  Anesthesia Plan: Spinal   Post-op Pain Management:    Induction:   PONV Risk Score and Plan: 4 or greater and Scopolamine patch - Pre-op, Ondansetron, Dexamethasone and Treatment may vary due to age or medical condition  Airway Management Planned: Natural Airway  Additional Equipment:   Intra-op Plan:   Post-operative Plan:   Informed Consent: I have reviewed the patients History and Physical, chart, labs and discussed the procedure including the risks, benefits and alternatives for the proposed anesthesia with the patient or authorized representative who has indicated his/her understanding and acceptance.     Plan Discussed with: CRNA and Surgeon  Anesthesia Plan Comments:         Anesthesia Quick Evaluation

## 2018-01-14 NOTE — Op Note (Signed)
Cesarean Section Procedure Note  Pre-operative Diagnosis: 1. Intrauterine pregnancy at [redacted]w[redacted]d  2. Labor  Post-operative Diagnosis: same as above  Surgeon: Marlow Baars, MD  Procedure: Repeat low transverse cesarean section   Anesthesia: Spinal anesthesia  Estimated Blood Loss: 283 mL         Drains: Foley catheter         Specimens: placenta to L&D              Complications:  None; patient tolerated the procedure well.         Disposition: PACU - hemodynamically stable.  Findings:  Normal uterus, tubes and ovaries bilaterally.  Viable female infant, 66g (6lb 1.5oz) Apgars 9, 9.   Moderate adhesions of the rectus muscles in the midline.  No intra-abdominal adhesions.   Procedure Details   After spinal anesthesia was found to adequate , the patient was placed in the dorsal supine position with a leftward tilt, prepped and draped in the usual sterile manner. A Pfannenstiel incision was made and carried down through the subcutaneous tissue to the fascia.  The fascia was incised in the midline and the fascial incision was extended laterally with Mayo scissors. The superior aspect of the fascial incision was grasped with two Kocher clamps, tented up and the rectus muscles dissected off sharply. The rectus was then dissected off with blunt dissection and Mayo scissors inferiorly. The rectus muscles were densely adherent in the midline.  They were separated with sharp dissection and bovie cautery. The abdominal peritoneum was identified, tented up, entered sharply, and the incision was extended superiorly and inferiorly with good visualization of the bladder. The Alexis retractor was deployed. The vesicouterine peritoneum was identified, tented up, entered sharply, and the bladder flap was created digitally. A scalpel was then used to make a low transverse incision on the uterus which was extended in the cephalad-caudad direction with blunt dissection. The fluid was clear. The fetal vertex was  identified, elevated out of the pelvis and brought to the hysterotomy.  The head was delivered easily followed by the shoulders and body. A nuchal cord x 1 was reduced after delivery.  After a 60 second delay per protocol, the cord was clamped and cut and the infant was passed to the waiting neonatologist.  The placenta was then delivered spontaneously, intact and appear normal, the uterus was cleared of all clot and debris   The hysterotomy was repaired with #0 Monocryl in running locked fashion.  A figure of eight suture was placed at the left aspect of the hysterotomy, and excellent hemostasis was noted.  The Alexis retractor was removed from the abdomen. The peritoneum was examined and all vessels noted to be hemostatic. The abdominal cavity was cleared of all clot and debris.  The peritoneum was closed with 2-0 vicryl in a running fashion.  The rectus muscles were then closed with 2-0 Vicryl. The fascia and rectus muscles were inspected.  There were multiple areas of oozing on the rectus muscles that were controlled with bovie cautery.  The fascia was closed with 0 Vicryl in a running fashion. The subcutaneous layer was irrigated and all bleeders cauterized. The subcutaneous layer was closed with interrupted plain gut. The skin was closed with 3-0 monocryl in a subcuticular fashion. The incision was dressed with benzoine, steri strips and honeycomb dressing. All sponge lap and needle counts were correct x3. Patient tolerated the procedure well and recovered in stable condition following the procedure.

## 2018-01-14 NOTE — Anesthesia Procedure Notes (Signed)
Spinal  Patient location during procedure: OR Start time: 01/14/2018 9:53 AM End time: 01/14/2018 9:56 AM Staffing Anesthesiologist: Mal Amabile, MD Performed: anesthesiologist  Preanesthetic Checklist Completed: patient identified, site marked, surgical consent, pre-op evaluation, timeout performed, IV checked, risks and benefits discussed and monitors and equipment checked Spinal Block Patient position: sitting Prep: site prepped and draped and DuraPrep Patient monitoring: heart rate, cardiac monitor, continuous pulse ox and blood pressure Approach: midline Location: L3-4 Injection technique: single-shot Needle Needle type: Pencan  Needle gauge: 24 G Needle length: 9 cm Needle insertion depth: 6 cm Assessment Sensory level: T4 Additional Notes Patient tolerated procedure well. Adequate sensory level.

## 2018-01-14 NOTE — Addendum Note (Signed)
Addendum  created 01/14/18 1743 by Algis Greenhouse, CRNA   Sign clinical note

## 2018-01-14 NOTE — Transfer of Care (Signed)
Immediate Anesthesia Transfer of Care Note  Patient: Ruth Gross  Procedure(s) Performed: REPEAT CESAREAN SECTION (N/A )  Patient Location: PACU  Anesthesia Type:Spinal  Level of Consciousness: awake and alert   Airway & Oxygen Therapy: Patient Spontanous Breathing  Post-op Assessment: Report given to RN and Post -op Vital signs reviewed and stable  Post vital signs: Reviewed  Last Vitals:  Vitals Value Taken Time  BP 92/73 01/14/2018 11:15 AM  Temp    Pulse 79 01/14/2018 11:16 AM  Resp 14 01/14/2018 11:16 AM  SpO2 100 % 01/14/2018 11:16 AM  Vitals shown include unvalidated device data.  Last Pain:  Vitals:   01/14/18 0618  TempSrc: Oral  PainSc: 7          Complications: No apparent anesthesia complications

## 2018-01-14 NOTE — Anesthesia Postprocedure Evaluation (Signed)
Anesthesia Post Note  Patient: Ruth Gross  Procedure(s) Performed: REPEAT CESAREAN SECTION (N/A )     Patient location during evaluation: PACU Anesthesia Type: Spinal Level of consciousness: oriented and awake and alert Pain management: pain level controlled Vital Signs Assessment: post-procedure vital signs reviewed and stable Respiratory status: spontaneous breathing, respiratory function stable and nonlabored ventilation Cardiovascular status: blood pressure returned to baseline and stable Postop Assessment: no headache, no backache, no apparent nausea or vomiting, patient able to bend at knees and spinal receding Anesthetic complications: no    Last Vitals:  Vitals:   01/14/18 1200 01/14/18 1215  BP: (!) 100/59 108/68  Pulse: 73 73  Resp: 15 20  Temp:    SpO2: 100% 100%    Last Pain:  Vitals:   01/14/18 1215  TempSrc:   PainSc: 2    Pain Goal:    LLE Motor Response: Purposeful movement (01/14/18 1215) LLE Sensation: Numbness;Tingling (01/14/18 1215) RLE Motor Response: Purposeful movement (01/14/18 1215) RLE Sensation: Numbness;Tingling (01/14/18 1215)      Fidelis Loth A.

## 2018-01-14 NOTE — MAU Note (Signed)
Dr. Chestine Spore in discussing repeat C/S with pt.

## 2018-01-14 NOTE — MAU Note (Signed)
Pt presents to MAU c/o ctx every that has been ongoing since 0315. Pt denies LOF or VB and reports +FM. No other complaints.

## 2018-01-15 LAB — CBC
HCT: 30 % — ABNORMAL LOW (ref 36.0–46.0)
HEMOGLOBIN: 9.3 g/dL — AB (ref 12.0–15.0)
MCH: 24 pg — AB (ref 26.0–34.0)
MCHC: 31 g/dL (ref 30.0–36.0)
MCV: 77.5 fL — ABNORMAL LOW (ref 80.0–100.0)
Platelets: 266 10*3/uL (ref 150–400)
RBC: 3.87 MIL/uL (ref 3.87–5.11)
RDW: 14 % (ref 11.5–15.5)
WBC: 11.4 10*3/uL — AB (ref 4.0–10.5)
nRBC: 0 % (ref 0.0–0.2)

## 2018-01-15 LAB — BIRTH TISSUE RECOVERY COLLECTION (PLACENTA DONATION)

## 2018-01-15 NOTE — Progress Notes (Signed)
Patient is doing well.  She is ambulating, voiding, tolerating PO.  Pain control is good.  Lochia is appropriate  Vitals:   01/14/18 2025 01/15/18 0100 01/15/18 0454 01/15/18 0856  BP:  (!) 107/54 (!) 96/56 (!) 104/57  Pulse:  64 80 (!) 52  Resp:  16 18 16   Temp:  98.4 F (36.9 C) 98.4 F (36.9 C) 98.4 F (36.9 C)  TempSrc:  Oral Oral Axillary  SpO2: 99% 99% 99% 100%  Weight:        NAD Fundus firm.  Honeycomb c/d/i Ext: trace edema b/l  Lab Results  Component Value Date   WBC 11.4 (H) 01/15/2018   HGB 9.3 (L) 01/15/2018   HCT 30.0 (L) 01/15/2018   MCV 77.5 (L) 01/15/2018   PLT 266 01/15/2018    --/--/O POS (11/01 0820)/RImmune  A/P 22 y.o. [id number] POD#1 s/p RCS. Routine care.   Anticipate d/c tomorrow   [facility] GEFFEL Chestine Spore

## 2018-01-15 NOTE — Progress Notes (Signed)
MOB was referred for history of depression/anxiety. * Referral screened out by Clinical Social Worker because none of the following criteria appear to apply: ~ History of anxiety/depression during this pregnancy, or of post-partum depression following prior delivery. ~ Diagnosis of anxiety and/or depression within last 3 years OR * MOB's symptoms currently being treated with medication and/or therapy. Please contact the Clinical Social Worker if needs arise, by MOB request, or if MOB scores greater than 9/yes to question 10 on Edinburgh Postpartum Depression Screen.  Marli Diego, LCSW Clinical Social Worker  [facility] [employer]  [phone]  

## 2018-01-16 ENCOUNTER — Encounter (HOSPITAL_COMMUNITY): Payer: Self-pay | Admitting: *Deleted

## 2018-01-16 MED ORDER — GABAPENTIN 300 MG PO CAPS
300.0000 mg | ORAL_CAPSULE | Freq: Three times a day (TID) | ORAL | Status: DC | PRN
  Administered 2018-01-16: 300 mg via ORAL
  Filled 2018-01-16 (×2): qty 1

## 2018-01-16 MED ORDER — IBUPROFEN 600 MG PO TABS: 600.0000 mg | ORAL_TABLET | Freq: Four times a day (QID) | ORAL | 0 refills | Status: DC

## 2018-01-16 MED ORDER — OXYCODONE HCL 5 MG PO TABS: 5.0000 mg | ORAL_TABLET | Freq: Four times a day (QID) | ORAL | 0 refills | Status: DC | PRN

## 2018-01-16 MED ORDER — GABAPENTIN 300 MG PO CAPS: 300.0000 mg | ORAL_CAPSULE | Freq: Three times a day (TID) | ORAL | 0 refills | Status: DC | PRN

## 2018-01-16 NOTE — Progress Notes (Signed)
TC from RN.  Pt received gabapentin with significant improvement in pain (last dose of ibuprofen and oxycodone was 0500).  Will d/c home at this time with prescription for gabapentin.

## 2018-01-16 NOTE — Progress Notes (Signed)
Patient is doing well.  She is ambulating, voiding, tolerating PO.  Lochia is appropriate.  She reports that her pain is not well controlled.  She is comfortable w rest, but starting yesterday afternoon, she has a superficial burning pain at the skin on the left abdomen / pelvis.  Her incisional pain is manageable and expected.  She has been taking motrin and oxycodone 10mg  without great improvement.  Denies n/v.  Eating well.    Vitals:   01/15/18 0454 01/15/18 0856 01/15/18 1429 01/15/18 2222  BP: (!) 96/56 (!) 104/57 95/69 (!) 121/56  Pulse: 80 (!) 52 (!) 58 71  Resp: 18 16 17 18   Temp: 98.4 F (36.9 C) 98.4 F (36.9 C) 98 F (36.7 C) 98.5 F (36.9 C)  TempSrc: Oral Axillary Oral Oral  SpO2: 99% 100% 100%   Weight:        NAD Abdomen:  Soft, non-distended.  Fundus firm, appropriately tender.  Honeycomb c/d/i Ext: trace edema b/l  Lab Results  Component Value Date   WBC 11.4 (H) 01/15/2018   HGB 9.3 (L) 01/15/2018   HCT 30.0 (L) 01/15/2018   MCV 77.5 (L) 01/15/2018   PLT 266 01/15/2018    --/--/O POS (11/01 0820)/RImmune  A/P 22 y.o. [id number] POD#2 s/p RCS. Routine care.   Possible neuropathic pain.  Will try gabapentin now (not breastfeeding) to try to improve pain and decrease oxycodone use.  Patient desires discharge today--will f/u on gabapentin effectiveness first  [facility] GEFFEL [place]

## 2018-01-16 NOTE — Discharge Instructions (Signed)
You may wash incision with soap and water.   °Do not soak the incision for 2 weeks (no tub baths or swimming).   °Keep incision dry. You may need to keep a sanitary pad or panty liner between the incision and your clothing for comfort and to keep the incision dry.  If you note drainage, increased pain, or increased redness of the incision, then please notify your physician. ° °Pelvic rest x 6 weeks (no intercourse or tampons)  ° °No lifting over 10 lbs for 6 weeks.  ° °Do not drive until you are not taking narcotic pain medication AND you can comfortably slam on the brakes. ° °You have a mild anemia due to blood loss from delivery.  Please continue to take a prenatal vitamin daily.  You may experience constipation from the iron, so add a stool softener as needed. ° °

## 2018-01-16 NOTE — Discharge Summary (Signed)
Obstetric Discharge Summary Reason for Admission: onset of labor Prenatal Procedures: none Intrapartum Procedures: cesarean: low cervical, transverse Postpartum Procedures: none Complications-Operative and Postpartum: none Hemoglobin  Date Value Ref Range Status  01/15/2018 9.3 (L) 12.0 - 15.0 g/dL Final   HCT  Date Value Ref Range Status  01/15/2018 30.0 (L) 36.0 - 46.0 % Final    Physical Exam:  General: alert, cooperative and appears stated age 22: appropriate Uterine Fundus: firm Incision: healing well, no significant drainage DVT Evaluation: No evidence of DVT seen on physical exam.  Discharge Diagnoses: Term Pregnancy-delivered  Discharge Information: Date: 01/16/2018 Activity: pelvic rest Diet: routine Medications: PNV, Ibuprofen and oxycodone and gabapentin Condition: stable Instructions: refer to practice specific booklet Discharge to: home Follow-up Information    Marlow Baars, MD Follow up in 4 week(s).   Specialty:  Obstetrics Contact information: [address] [place] [address] [phone]           Newborn Data: Live born female  Birth Weight: 6 lb 1.5 oz (2765 g) APGAR: 9, 9  Newborn Delivery   Birth date/time:  01/14/2018 10:21:00 Delivery type:  C-Section, Low Transverse Trial of labor:  No C-section categorization:  Repeat     Home with mother.  Izick Gasbarro GEFFEL Gearold Wainer 01/16/2018, 11:12 AM

## 2018-01-19 ENCOUNTER — Encounter (HOSPITAL_COMMUNITY)
Admission: RE | Admit: 2018-01-19 | Discharge: 2018-01-19 | Disposition: A | Discharge status: Home / Self Care | Payer: Medicaid Other | Source: Ambulatory Visit | Attending: Obstetrics and Gynecology | Admitting: Obstetrics and Gynecology

## 2018-01-20 ENCOUNTER — Inpatient Hospital Stay (HOSPITAL_COMMUNITY): Admit: 2018-01-20 | Payer: Medicaid Other | Admitting: Obstetrics and Gynecology

## 2019-02-10 ENCOUNTER — Other Ambulatory Visit: Payer: Self-pay

## 2019-02-10 ENCOUNTER — Emergency Department (HOSPITAL_COMMUNITY)
Admission: EM | Admit: 2019-02-10 | Discharge: 2019-02-10 | Disposition: A | Discharge status: Home / Self Care | Payer: Medicaid Other | Attending: Emergency Medicine | Admitting: Emergency Medicine

## 2019-02-10 ENCOUNTER — Encounter (HOSPITAL_COMMUNITY): Payer: Self-pay | Admitting: *Deleted

## 2019-02-10 DIAGNOSIS — Z20822 Contact with and (suspected) exposure to covid-19: Secondary | ICD-10-CM

## 2019-02-10 DIAGNOSIS — J020 Streptococcal pharyngitis: Secondary | ICD-10-CM | POA: Insufficient documentation

## 2019-02-10 DIAGNOSIS — Z20828 Contact with and (suspected) exposure to other viral communicable diseases: Secondary | ICD-10-CM | POA: Insufficient documentation

## 2019-02-10 DIAGNOSIS — Z79899 Other long term (current) drug therapy: Secondary | ICD-10-CM | POA: Insufficient documentation

## 2019-02-10 LAB — SARS CORONAVIRUS 2 (TAT 6-24 HRS): SARS Coronavirus 2: NEGATIVE

## 2019-02-10 LAB — GROUP A STREP BY PCR: Group A Strep by PCR: DETECTED — AB

## 2019-02-10 MED ORDER — DEXAMETHASONE SODIUM PHOSPHATE 10 MG/ML IJ SOLN
10.0000 mg | Freq: Once | INTRAMUSCULAR | Status: AC
  Administered 2019-02-10: 10 mg via INTRAMUSCULAR
  Filled 2019-02-10: qty 1

## 2019-02-10 MED ORDER — AMOXICILLIN 250 MG PO CAPS
1000.0000 mg | ORAL_CAPSULE | Freq: Once | ORAL | Status: AC
  Administered 2019-02-10: 1000 mg via ORAL
  Filled 2019-02-10: qty 4

## 2019-02-10 MED ORDER — ACETAMINOPHEN 500 MG PO TABS
1000.0000 mg | ORAL_TABLET | Freq: Once | ORAL | Status: AC
  Administered 2019-02-10: 1000 mg via ORAL
  Filled 2019-02-10: qty 2

## 2019-02-10 MED ORDER — AMOXICILLIN 500 MG PO CAPS: 1000.0000 mg | ORAL_CAPSULE | Freq: Every day | ORAL | 0 refills | Status: AC

## 2019-02-10 NOTE — ED Provider Notes (Signed)
[facility] EMERGENCY DEPARTMENT Provider Note   CSN: [id number] Arrival date & time: 02/10/19  0012     History   Chief Complaint Chief Complaint  Patient presents with  . Sore Throat  . Fever    HPI Ruth Gross is a 23 y.o. female.     Patient with asthma history presents with sore throat, fever and diarrhea throughout the week.  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  No breathing difficulty, no significant cough.  Patient tried Tylenol and NSAID with minimal relief.     Past Medical History:  Diagnosis Date  . Anemia   . Asthma   . Headache   . Heart problem    Pt states she had a "heart attack" in September.  [facility] discharged after normal testing with flexeril and naproxen.    Marland Kitchen. Hx of chlamydia infection   . Hx of pyelonephritis during pregnancy 04/2016  . Pyelonephritis affecting pregnancy   . Vertigo     Patient Active Problem List   Diagnosis Date Noted  . History of cesarean delivery 01/14/2018  . Fall down stairs 12/28/2017  . Status post repeat low transverse cesarean section 09/02/2016  . Pyelonephritis 05/09/2016  . Supervision of other normal pregnancy 02/11/2013  . Pregnancy 02/09/2013  . Anemia 06/16/2012  . History of miscarriage 06/16/2012    Past Surgical History:  Procedure Laterality Date  . CESAREAN SECTION N/A 02/10/2013   Procedure: CESAREAN SECTION;  Surgeon: Levi AlandMark E Anderson, MD;  Location: [facility];  Service: Obstetrics;  Laterality: N/A;  . CESAREAN SECTION N/A 09/02/2016   Procedure: CESAREAN SECTION;  Surgeon: Essie HartPinn, Walda, MD;  [facility];  Service: Obstetrics;  Laterality: N/A;  . CESAREAN SECTION N/A 01/14/2018   Procedure: REPEAT CESAREAN SECTION;  Surgeon: Marlow Baarslark, Dyanna, MD;  [facility];  Service: Obstetrics;  Laterality: N/A;  RNFA AVAILABLE  . DILATION AND EVACUATION  04/10/2012   Procedure: DILATATION AND  EVACUATION;  Surgeon: Mickel Baasichard D Kaplan, MD;  Location: [facility];  Service: Gynecology;  Laterality: N/A;  . DILATION AND EVACUATION N/A 06/29/2012   Procedure: DILATATION AND EVACUATION;  Surgeon: Mickel Baasichard D Kaplan, MD;  Location: [facility];  Service: Gynecology;  Laterality: N/A;     OB History    Gravida  4   Para  2   Term  2   Preterm      AB  1   Living  2     SAB  1   TAB      Ectopic      Multiple  0   Live Births  2            Home Medications    Prior to Admission medications   Medication Sig Start Date End Date Taking? Authorizing Provider  FLUoxetine (PROZAC) 20 MG tablet Take 20 mg by mouth daily.    [provider]  gabapentin (NEURONTIN) 300 MG capsule Take 1 capsule (300 mg total) by mouth 3 (three) times daily as needed (nerve pain). 01/16/18   Marlow Baarslark, Dyanna, MD  ibuprofen (ADVIL,MOTRIN) 600 MG tablet Take 1 tablet (600 mg total) by mouth every 6 (six) hours. 01/16/18   Marlow Baarslark, Dyanna, MD  oxyCODONE (OXY IR/ROXICODONE) 5 MG immediate release tablet Take 1-2 tablets (5-10 mg total) by mouth every 6 (six) hours as needed. 01/16/18   Marlow Baarslark, Dyanna, MD  Prenatal Vit-Fe Fumarate-FA (PRENATAL  MULTIVITAMIN) TABS tablet Take 1 tablet by mouth daily at 12 noon.    [provider]    Family History Family History  Problem Relation Age of Onset  . Diabetes Father   . Diabetes Sister   . Heart disease Paternal Uncle   . Heart disease Paternal Grandmother   . Other Neg Hx     Social History Social History   Tobacco Use  . Smoking status: Never Smoker  . Smokeless tobacco: Never Used  Substance Use Topics  . Alcohol use: Yes    Comment: occasionally  . Drug use: No     Allergies   Patient has no known allergies.   Review of Systems Review of Systems  Constitutional: Positive for fever. Negative for chills.  HENT: Positive for congestion.   Eyes: Negative for visual disturbance.  Respiratory: Negative for shortness of breath.    Cardiovascular: Negative for chest pain.  Gastrointestinal: Positive for diarrhea and vomiting. Negative for abdominal pain.  Genitourinary: Negative for dysuria and flank pain.  Musculoskeletal: Negative for back pain, neck pain and neck stiffness.  Skin: Negative for rash.  Neurological: Negative for light-headedness and headaches.     Physical Exam Updated Vital Signs BP 111/71 (BP Location: Right Arm)   Pulse 88   Temp 100.1 F (37.8 C) (Oral)   Resp 20   Ht 5\' 4"  (1.626 m)   Wt 72.6 kg   LMP 01/22/2019   BMI 27.46 kg/m   Physical Exam Vitals signs and nursing note reviewed.  Constitutional:      Appearance: She is well-developed.  HENT:     Head: Normocephalic and atraumatic.     Comments: No trismus, uvular deviation, unilateral posterior pharyngeal edema or submandibular swelling.  Posterior erythema and generalized edema, no stridor or trismus, no submandibular swelling Eyes:     General:        Right eye: No discharge.        Left eye: No discharge.     Conjunctiva/sclera: Conjunctivae normal.  Neck:     Musculoskeletal: Normal range of motion and neck supple.     Trachea: No tracheal deviation.  Cardiovascular:     Rate and Rhythm: Normal rate and regular rhythm.  Pulmonary:     Effort: Pulmonary effort is normal.     Breath sounds: Normal breath sounds.  Abdominal:     General: There is no distension.     Palpations: Abdomen is soft.     Tenderness: There is no abdominal tenderness. There is no guarding.  Skin:    General: Skin is warm.     Findings: No rash.  Neurological:     Mental Status: She is alert and oriented to person, place, and time.      ED Treatments / Results  Labs (all labs ordered are listed, but only abnormal results are displayed) Labs Reviewed  GROUP A STREP BY PCR  SARS CORONAVIRUS 2 (TAT 6-24 HRS)    EKG None  Radiology No results found.  Procedures Procedures (including critical care time)  Medications  Ordered in ED Medications  acetaminophen (TYLENOL) tablet 1,000 mg (has no administration in time range)  dexamethasone (DECADRON) injection 10 mg (has no administration in time range)     Initial Impression / Assessment and Plan / ED Course  I have reviewed the triage vital signs and the nursing notes.  Pertinent labs & imaging results that were available during my care of the patient were reviewed by me and  considered in my medical decision making (see chart for details).       Patient presents with clinical concern for pharyngitis versus Covid.  Strep test pending, discussed isolation and supportive care for possible Covid infection.  Work note given.  Patient instructed not to return to work until cleared from test and if positive at least 10 days out of work in [place]. Strep test positive, first dose of amoxicillin 1 g given in ER.  Supportive care.  Prescription and work note given. Ruth Gross was evaluated in Emergency Department on 02/10/2019 for the symptoms described in the history of present illness. She was evaluated in the context of the global COVID-19 pandemic, which necessitated consideration that the patient might be at risk for infection with the SARS-CoV-2 virus that causes COVID-19. Institutional protocols and algorithms that pertain to the evaluation of patients at risk for COVID-19 are in a state of rapid change based on information released by regulatory bodies including the CDC and federal and state organizations. These policies and algorithms were followed during the patient's care in the ED.  Final Clinical Impressions(s) / ED Diagnoses   Final diagnoses:  Acute pharyngitis, unspecified etiology  Close exposure to COVID-19 virus    ED Discharge Orders    None       Elnora Morrison, MD 02/10/19 0131

## 2019-02-10 NOTE — ED Triage Notes (Signed)
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 Pt has developed a sore throat and fever since last weekend.

## 2019-02-10 NOTE — Discharge Instructions (Addendum)
Isolate yourself until you receive your Covid test result.\ Take antibiotics for 10 days. Take Tylenol and Motrin for pain and fever stay well-hydrated. Return for breathing difficulty, tongue swelling, worsening throat swelling, chest pain or new concerns. They should call you if it is positive but you can check on your chart or contact your primary doctor to confirm result.  You may not return to work until you are cleared from [place].

## 2019-02-10 NOTE — ED Notes (Signed)
Pt ambulatory to waiting room. Pt verbalized understanding of discharge instructions.   

## 2019-06-29 LAB — OB RESULTS CONSOLE HEPATITIS B SURFACE ANTIGEN: Hepatitis B Surface Ag: NEGATIVE

## 2019-06-29 LAB — OB RESULTS CONSOLE GC/CHLAMYDIA
Chlamydia: NEGATIVE
Gonorrhea: NEGATIVE

## 2019-06-29 LAB — OB RESULTS CONSOLE HIV ANTIBODY (ROUTINE TESTING): HIV: NONREACTIVE

## 2019-06-29 LAB — OB RESULTS CONSOLE RPR: RPR: NONREACTIVE

## 2019-06-29 LAB — OB RESULTS CONSOLE RUBELLA ANTIBODY, IGM: Rubella: IMMUNE

## 2019-08-25 ENCOUNTER — Encounter: Payer: Self-pay | Admitting: Emergency Medicine

## 2019-08-25 ENCOUNTER — Emergency Department: Payer: Medicaid Other

## 2019-08-25 ENCOUNTER — Other Ambulatory Visit: Payer: Self-pay

## 2019-08-25 ENCOUNTER — Emergency Department
Admission: EM | Admit: 2019-08-25 | Discharge: 2019-08-25 | Disposition: A | Discharge status: Home / Self Care | Payer: Medicaid Other | Attending: Emergency Medicine | Admitting: Emergency Medicine

## 2019-08-25 DIAGNOSIS — R0602 Shortness of breath: Secondary | ICD-10-CM

## 2019-08-25 DIAGNOSIS — O99512 Diseases of the respiratory system complicating pregnancy, second trimester: Secondary | ICD-10-CM | POA: Diagnosis not present

## 2019-08-25 DIAGNOSIS — I83811 Varicose veins of right lower extremities with pain: Secondary | ICD-10-CM

## 2019-08-25 DIAGNOSIS — Z3A17 17 weeks gestation of pregnancy: Secondary | ICD-10-CM | POA: Insufficient documentation

## 2019-08-25 DIAGNOSIS — J45909 Unspecified asthma, uncomplicated: Secondary | ICD-10-CM | POA: Diagnosis not present

## 2019-08-25 DIAGNOSIS — O2202 Varicose veins of lower extremity in pregnancy, second trimester: Secondary | ICD-10-CM | POA: Diagnosis not present

## 2019-08-25 DIAGNOSIS — O26892 Other specified pregnancy related conditions, second trimester: Secondary | ICD-10-CM | POA: Diagnosis present

## 2019-08-25 LAB — COMPREHENSIVE METABOLIC PANEL
ALT: 10 U/L (ref 0–44)
AST: 12 U/L — ABNORMAL LOW (ref 15–41)
Albumin: 3.3 g/dL — ABNORMAL LOW (ref 3.5–5.0)
Alkaline Phosphatase: 85 U/L (ref 38–126)
Anion gap: 8 (ref 5–15)
BUN: 5 mg/dL — ABNORMAL LOW (ref 6–20)
CO2: 23 mmol/L (ref 22–32)
Calcium: 8.5 mg/dL — ABNORMAL LOW (ref 8.9–10.3)
Chloride: 105 mmol/L (ref 98–111)
Creatinine, Ser: 0.49 mg/dL (ref 0.44–1.00)
GFR calc Af Amer: >60 mL/min (ref >60)
GFR calc non Af Amer: >60 mL/min (ref >60)
Glucose, Bld: 78 mg/dL (ref 70–99)
Potassium: 3.7 mmol/L (ref 3.5–5.1)
Sodium: 136 mmol/L (ref 135–145)
Total Bilirubin: 0.5 mg/dL (ref 0.3–1.2)
Total Protein: 7.1 g/dL (ref 6.5–8.1)

## 2019-08-25 LAB — TROPONIN I (HIGH SENSITIVITY): Troponin I (High Sensitivity): <2 ng/L (ref <18)

## 2019-08-25 LAB — CBC WITH DIFFERENTIAL/PLATELET
Abs Immature Granulocytes: 0.05 10*3/uL (ref 0.00–0.07)
Basophils Absolute: 0 10*3/uL (ref 0.0–0.1)
Basophils Relative: 0 %
Eosinophils Absolute: 0.2 10*3/uL (ref 0.0–0.5)
Eosinophils Relative: 2 %
HCT: 33.3 % — ABNORMAL LOW (ref 36.0–46.0)
Hemoglobin: 10.9 g/dL — ABNORMAL LOW (ref 12.0–15.0)
Immature Granulocytes: 0 %
Lymphocytes Relative: 23 %
Lymphs Abs: 2.8 10*3/uL (ref 0.7–4.0)
MCH: 27.4 pg (ref 26.0–34.0)
MCHC: 32.7 g/dL (ref 30.0–36.0)
MCV: 83.7 fL (ref 80.0–100.0)
Monocytes Absolute: 0.6 10*3/uL (ref 0.1–1.0)
Monocytes Relative: 5 %
Neutro Abs: 8.6 10*3/uL — ABNORMAL HIGH (ref 1.7–7.7)
Neutrophils Relative %: 70 %
Platelets: 298 10*3/uL (ref 150–400)
RBC: 3.98 MIL/uL (ref 3.87–5.11)
RDW: 12.7 % (ref 11.5–15.5)
WBC: 12.2 10*3/uL — ABNORMAL HIGH (ref 4.0–10.5)
nRBC: 0 % (ref 0.0–0.2)

## 2019-08-25 LAB — FIBRIN DERIVATIVES D-DIMER (ARMC ONLY): Fibrin derivatives D-dimer (ARMC): 644.47 ng/mL (FEU) — ABNORMAL HIGH (ref 0.00–499.00)

## 2019-08-25 MED ORDER — IOHEXOL 350 MG/ML SOLN
75.0000 mL | Freq: Once | INTRAVENOUS | Status: AC | PRN
  Administered 2019-08-25: 75 mL via INTRAVENOUS
  Filled 2019-08-25: qty 75

## 2019-08-25 NOTE — ED Triage Notes (Signed)
Says over the past 2-3 days has vein on back of knee area of right leg that has gotten bigger and is tender to touch.  I asked about shortness of breath. Says that she has noticed that sometimes she has shortness of breath for couple days too.  Enlarged Vein is visible on right leg with no redness.

## 2019-08-25 NOTE — ED Provider Notes (Signed)
Emergency Department Provider Note  ____________________________________________  Time seen: Approximately 4:21 PM  I have reviewed the triage vital signs and the nursing notes.   HISTORY  Chief Complaint Leg Pain and Shortness of Breath   Historian Patient    HPI Ruth Gross is a 24 y.o. female currently [redacted] weeks pregnant, presents to the emergency department with concern for a painful, distended vein along the posterior aspect of her right leg.  Patient states that she is never experienced similar symptoms before pregnancy.  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  When questioned, patient states that she has noticed worsening shortness of breath that started today.  No chest tightness or chest pain.  She denies current smoking.  No pelvic pain, vaginal bleeding or abdominal discomfort.  No gush of vaginal fluids.  No other alleviating measures have been attempted.   Past Medical History:  Diagnosis Date  . Anemia   . Asthma   . Headache   . Heart problem    Pt states she had a "heart attack" in September.  [facility] ED note indicated discharged after normal testing with flexeril and naproxen.    Marland Kitchen Hx of chlamydia infection   . Hx of pyelonephritis during pregnancy 04/2016  . Pyelonephritis affecting pregnancy   . Vertigo      Immunizations up to date:  Yes.     Past Medical History:  Diagnosis Date  . Anemia   . Asthma   . Headache   . Heart problem    Pt states she had a "heart attack" in September.  [facility] ED note indicated discharged after normal testing with flexeril and naproxen.    Marland Kitchen Hx of chlamydia infection   . Hx of pyelonephritis during pregnancy 04/2016  . Pyelonephritis affecting pregnancy   . Vertigo     Patient Active Problem List   Diagnosis Date Noted  . History of cesarean delivery 01/14/2018  . Fall down stairs 12/28/2017  . Status post repeat low transverse cesarean section 09/02/2016  . Pyelonephritis 05/09/2016  . Supervision of  other normal pregnancy 02/11/2013  . Pregnancy 02/09/2013  . Anemia 06/16/2012  . History of miscarriage 06/16/2012    Past Surgical History:  Procedure Laterality Date  . CESAREAN SECTION N/A 02/10/2013   Procedure: CESAREAN SECTION;  Surgeon: Levi Aland, MD;  Location: [facility];  Service: Obstetrics;  Laterality: N/A;  . CESAREAN SECTION N/A 09/02/2016   Procedure: CESAREAN SECTION;  Surgeon: Essie Hart, MD;  Location: [facility] BIRTHING SUIT[facility];  Service: Obstetrics;  Laterality: N/A;  . CESAREAN SECTION N/A 01/14/2018   Procedure: REPEAT CESAREAN SECTION;  Surgeon: Marlow Baars, MD;  Location: [facility] BIRTHING SUITES;  Service: Obstetrics;  Laterality: N/A;  RNFA AVAILABLE  . DILATION AND EVACUATION  04/10/2012   Procedure: DILATATION AND EVACUATION;  Surgeon: Mickel Baas, MD;  Location: [facility];  Service: Gynecology;  Laterality: N/A;  . DILATION AND EVACUATION N/A 06/29/2012   Procedure: DILATATION AND EVACUATION;  Surgeon: Mickel Baas, MD;  Location: [facility];  Service: Gynecology;  Laterality: N/A;    Prior to Admission medications   Medication Sig Start Date End Date Taking? Authorizing Provider  Prenatal Vit-Fe Fumarate-FA (PRENATAL MULTIVITAMIN) TABS tablet Take 1 tablet by mouth daily at 12 noon.    [provider]    Allergies Patient has no known allergies.  Family History  Problem Relation Age of Onset  . Diabetes Father   . Diabetes Sister   . Heart  disease Paternal Uncle   . Heart disease Paternal Grandmother   . Other Neg Hx     Social History Social History   Tobacco Use  . Smoking status: Never Smoker  . Smokeless tobacco: Never Used  Vaping Use  . Vaping Use: Never used  Substance Use Topics  . Alcohol use: Yes    Comment: occasionally  . Drug use: No     Review of Systems  Constitutional: No fever/chills Eyes:  No discharge ENT: No upper respiratory complaints. Respiratory: no cough. No SOB/ use of accessory muscles to  breath Gastrointestinal:   No nausea, no vomiting.  No diarrhea.  No constipation. Musculoskeletal: Negative for musculoskeletal pain. Skin: Patient has painful and tender right lower extremity vein.    ____________________________________________   PHYSICAL EXAM:  VITAL SIGNS: ED Triage Vitals  Enc Vitals Group     BP 08/25/19 1402 92/72     Pulse Rate 08/25/19 1402 (!) 110     Resp 08/25/19 1402 16     Temp 08/25/19 1402 98.4 F (36.9 C)     Temp Source 08/25/19 1402 Oral     SpO2 08/25/19 1402 98 %     Weight 08/25/19 1403 180 lb (81.6 kg)     Height 08/25/19 1403 5\' 4"  (1.626 m)     Head Circumference --      Peak Flow --      Pain Score 08/25/19 1402 7     Pain Loc --      Pain Edu? --      Excl. in [place]? --      Constitutional: Alert and oriented. Well appearing and in no acute distress. Eyes: Conjunctivae are normal. PERRL. EOMI. Head: Atraumatic. Cardiovascular: Normal rate, regular rhythm. Normal S1 and S2.  Good peripheral circulation. Respiratory: Normal respiratory effort without tachypnea or retractions. Lungs CTAB. Good air entry to the bases with no decreased or absent breath sounds Gastrointestinal: Bowel sounds x 4 quadrants. Soft and nontender to palpation. No guarding or rigidity. No distention. Musculoskeletal: Full range of motion to all extremities. No obvious deformities noted Neurologic:  Normal for age. No gross focal neurologic deficits are appreciated.  Skin: Patient has a varicose vein visualized along the posterior aspect of the right leg. Psychiatric: Mood and affect are normal for age. Speech and behavior are normal.   ____________________________________________   LABS (all labs ordered are listed, but only abnormal results are displayed)  Labs Reviewed  CBC WITH DIFFERENTIAL/PLATELET - Abnormal; Notable for the following components:      Result Value   WBC 12.2 (*)    Hemoglobin 10.9 (*)    HCT 33.3 (*)    Neutro Abs 8.6 (*)     All other components within normal limits  COMPREHENSIVE METABOLIC PANEL - Abnormal; Notable for the following components:   BUN 5 (*)    Calcium 8.5 (*)    Albumin 3.3 (*)    AST 12 (*)    All other components within normal limits  FIBRIN DERIVATIVES D-DIMER ([facility] ONLY) - Abnormal; Notable for the following components:   Fibrin derivatives D-dimer [facility]) 644.47 (*)    All other components within normal limits  TROPONIN I (HIGH SENSITIVITY)   ____________________________________________  EKG   ____________________________________________  RADIOLOGY Unk Pinto, personally viewed and evaluated these images (plain radiographs) as part of my medical decision making, as well as reviewing the written report by the radiologist.  CT Angio Chest PE W and/or Wo Contrast  Result Date: 08/25/2019 CLINICAL DATA:  24 year old female with shortness of breath for several days. EXAM: CT ANGIOGRAPHY CHEST WITH CONTRAST TECHNIQUE: Multidetector CT imaging of the chest was performed using the standard protocol during bolus administration of intravenous contrast. Multiplanar CT image reconstructions and MIPs were obtained to evaluate the vascular anatomy. CONTRAST:  87mL OMNIPAQUE IOHEXOL 350 MG/ML SOLN COMPARISON:  CTA chest 12/11/2015. Right lower extremity ultrasound today. Chest radiographs 07/10/2017. FINDINGS: Cardiovascular: Good contrast bolus timing in the pulmonary arterial tree. No focal filling defect identified in the pulmonary arteries to suggest acute pulmonary embolism. No cardiomegaly or pericardial effusion. Negative visible aorta. No calcified coronary artery atherosclerosis is evident. Mediastinum/Nodes: Negative. Lungs/Pleura: Major airways are patent. A tiny right lung lateral subpleural nodule on series 6, image 48 is stable since 2017 and benign/postinflammatory. Similar stable and benign lateral basal segment left lower lobe lung nodules on series 6, image 58. Otherwise both lungs  are clear. No pleural effusion. Upper Abdomen: Negative visible liver, spleen, stomach and splenic flexure. Musculoskeletal: Negative. Review of the MIP images confirms the above findings. IMPRESSION: 1. Negative for acute pulmonary embolus. 2. Normal Chest CTA aside from several small benign lung nodules, unchanged from 2017. Electronically Signed   By: Odessa Fleming M.D.   On: 08/25/2019 18:02   US Venous Img Lower Unilateral Right  Result Date: 08/25/2019 CLINICAL DATA:  Right lower extremity pain and edema. EXAM: RIGHT LOWER EXTREMITY VENOUS DOPPLER ULTRASOUND TECHNIQUE: Gray-scale sonography with graded compression, as well as color Doppler and duplex ultrasound were performed to evaluate the lower extremity deep venous systems from the level of the common femoral vein and including the common femoral, femoral, profunda femoral, popliteal and calf veins including the posterior tibial, peroneal and gastrocnemius veins when visible. The superficial great saphenous vein was also interrogated. Spectral Doppler was utilized to evaluate flow at rest and with distal augmentation maneuvers in the common femoral, femoral and popliteal veins. COMPARISON:  None. FINDINGS: Contralateral Common Femoral Vein: Respiratory phasicity is normal and symmetric with the symptomatic side. No evidence of thrombus. Normal compressibility. Common Femoral Vein: No evidence of thrombus. Normal compressibility, respiratory phasicity and response to augmentation. Saphenofemoral Junction: No evidence of thrombus. Normal compressibility and flow on color Doppler imaging. Profunda Femoral Vein: No evidence of thrombus. Normal compressibility and flow on color Doppler imaging. Femoral Vein: No evidence of thrombus. Normal compressibility, respiratory phasicity and response to augmentation. Popliteal Vein: No evidence of thrombus. Normal compressibility, respiratory phasicity and response to augmentation. Calf Veins: No evidence of thrombus.  Normal compressibility and flow on color Doppler imaging. Superficial Great Saphenous Vein: No evidence of thrombus. Normal compressibility. Venous Reflux:  None. Other Findings: There are some patent superficial varicosities in the lateral right popliteal fossa and proximal calf region. No evidence of superficial thrombophlebitis or abnormal fluid collection. IMPRESSION: 1. No evidence of right lower extremity deep venous thrombosis. 2. Patent varicosities at the level of the knee/proximal calf without evidence of acute superficial thrombophlebitis. Electronically Signed   By: Irish Lack M.D.   On: 08/25/2019 15:42    ____________________________________________    PROCEDURES  Procedure(s) performed:     Procedures     Medications  iohexol (OMNIPAQUE) 350 MG/ML injection 75 mL (75 mLs Intravenous Contrast Given 08/25/19 1747)     ____________________________________________   INITIAL IMPRESSION / ASSESSMENT AND PLAN / ED COURSE  Pertinent labs & imaging results that were available during my care of the patient were reviewed by me and considered in  my medical decision making (see chart for details).      Assessment and plan:  Varicose vein Shortness of breath 24 year old female presents to the emergency department with a tender vein along the posterior aspect of the right lower extremity.  Patient also complained of shortness of breath that started today when questioned.  Vital signs were reassuring at triage and patient had no increased work of breathing.  On physical exam, patient had what appeared to be a varicose vein along the posterior aspect of the right lower extremity without surrounding erythema.  Venous ultrasound revealed no evidence of thrombus formation.  D-dimer was elevated in the emergency department and CTA revealed no evidence of PE.  Troponin was within reference range.  Patient denied abdominal pain, pelvic pain or vaginal bleeding.  Return  precautions were given to return with new or worsening symptoms.  ____________________________________________  FINAL CLINICAL IMPRESSION(S) / ED DIAGNOSES  Final diagnoses:  Shortness of breath  Varicose veins of right lower extremity with pain      NEW MEDICATIONS STARTED DURING THIS VISIT:  ED Discharge Orders    None          This chart was dictated using voice recognition software/Dragon. Despite best efforts to proofread, errors can occur which can change the meaning. Any change was purely unintentional.     Orvil Feil, PA-C 08/25/19 1918    Phineas Semen, MD 08/25/19 [phone]

## 2019-08-25 NOTE — Discharge Instructions (Signed)
Please wear compression stockings at work.

## 2019-08-25 NOTE — ED Notes (Signed)
See triage note  Presents with bruising and tenderness to posterior right knee and lower leg

## 2019-11-16 ENCOUNTER — Encounter (HOSPITAL_COMMUNITY): Payer: Self-pay

## 2019-11-16 ENCOUNTER — Encounter (HOSPITAL_COMMUNITY): Payer: Self-pay | Admitting: *Deleted

## 2019-11-16 NOTE — Patient Instructions (Addendum)
Ruth Gross  11/16/2019   Your procedure is scheduled on:  11/23/2019  Arrive at 1030 at Entrance C on [address] at [facility]  and [employer]. You are invited to use the FREE valet parking or use the Visitor's parking deck.  Pick up the phone at the desk and dial [phone].  Call this number if you have problems the morning of surgery: [phone]  Remember:   Do not eat food:(After Midnight) Desps de medianoche.  Do not drink clear liquids: (After Midnight) Desps de medianoche.  Take these medicines the morning of surgery with A SIP OF WATER:  none   Do not wear jewelry, make-up or nail polish.  Do not wear lotions, powders, or perfumes. Do not wear deodorant.  Do not shave 48 hours prior to surgery.  Do not bring valuables to the hospital.  [facility] is not   responsible for any belongings or valuables brought to the hospital.  Contacts, dentures or bridgework may not be worn into surgery.  Leave suitcase in the car. After surgery it may be brought to your room.  For patients admitted to the hospital, checkout time is 11:00 AM the day of              discharge.      Please read over the following fact sheets that you were given:     Preparing for Surgery

## 2019-11-17 ENCOUNTER — Inpatient Hospital Stay (HOSPITAL_COMMUNITY): Payer: Medicaid Other | Admitting: Anesthesiology

## 2019-11-17 ENCOUNTER — Encounter (HOSPITAL_COMMUNITY)
Admission: AD | Disposition: A | Discharge status: Home / Self Care | Payer: Self-pay | Source: Home / Self Care | Attending: Obstetrics and Gynecology

## 2019-11-17 ENCOUNTER — Inpatient Hospital Stay (HOSPITAL_COMMUNITY)
Admission: AD | Admit: 2019-11-17 | Discharge: 2019-11-20 | DRG: 788 | Disposition: A | Discharge status: Home / Self Care | Payer: Medicaid Other | Attending: Obstetrics and Gynecology | Admitting: Obstetrics and Gynecology

## 2019-11-17 ENCOUNTER — Encounter (HOSPITAL_COMMUNITY): Payer: Self-pay | Admitting: Obstetrics and Gynecology

## 2019-11-17 ENCOUNTER — Other Ambulatory Visit: Payer: Self-pay

## 2019-11-17 DIAGNOSIS — Z20822 Contact with and (suspected) exposure to covid-19: Secondary | ICD-10-CM | POA: Diagnosis present

## 2019-11-17 DIAGNOSIS — Z9889 Other specified postprocedural states: Secondary | ICD-10-CM

## 2019-11-17 DIAGNOSIS — O34211 Maternal care for low transverse scar from previous cesarean delivery: Principal | ICD-10-CM | POA: Diagnosis present

## 2019-11-17 DIAGNOSIS — Z3A37 37 weeks gestation of pregnancy: Secondary | ICD-10-CM

## 2019-11-17 LAB — TYPE AND SCREEN
ABO/RH(D): O POS
Antibody Screen: NEGATIVE

## 2019-11-17 LAB — URINALYSIS, ROUTINE W REFLEX MICROSCOPIC
Bilirubin Urine: NEGATIVE
Glucose, UA: NEGATIVE mg/dL
Hgb urine dipstick: NEGATIVE
Ketones, ur: NEGATIVE mg/dL
Leukocytes,Ua: NEGATIVE
Nitrite: NEGATIVE
Protein, ur: NEGATIVE mg/dL
Specific Gravity, Urine: 1.019 (ref 1.005–1.030)
pH: 6 (ref 5.0–8.0)

## 2019-11-17 LAB — CBC
HCT: 33.2 % — ABNORMAL LOW (ref 36.0–46.0)
Hemoglobin: 10 g/dL — ABNORMAL LOW (ref 12.0–15.0)
MCH: 22.7 pg — ABNORMAL LOW (ref 26.0–34.0)
MCHC: 30.1 g/dL (ref 30.0–36.0)
MCV: 75.3 fL — ABNORMAL LOW (ref 80.0–100.0)
Platelets: 299 10*3/uL (ref 150–400)
RBC: 4.41 MIL/uL (ref 3.87–5.11)
RDW: 14.6 % (ref 11.5–15.5)
WBC: 12 10*3/uL — ABNORMAL HIGH (ref 4.0–10.5)
nRBC: 0 % (ref 0.0–0.2)

## 2019-11-17 LAB — SARS CORONAVIRUS 2 BY RT PCR (HOSPITAL ORDER, PERFORMED IN ~~LOC~~ HOSPITAL LAB): SARS Coronavirus 2: NEGATIVE

## 2019-11-17 LAB — RPR: RPR Ser Ql: NONREACTIVE

## 2019-11-17 SURGERY — Surgical Case
Anesthesia: Spinal | Wound class: Clean Contaminated

## 2019-11-17 MED ORDER — PHENYLEPHRINE HCL-NACL 20-0.9 MG/250ML-% IV SOLN
INTRAVENOUS | Status: DC | PRN
  Administered 2019-11-17: 60 ug/min via INTRAVENOUS

## 2019-11-17 MED ORDER — EPHEDRINE 5 MG/ML INJ
INTRAVENOUS | Status: AC
  Filled 2019-11-17: qty 10

## 2019-11-17 MED ORDER — PROMETHAZINE HCL 25 MG/ML IJ SOLN: 6.2500 mg | INTRAMUSCULAR | Status: DC | PRN

## 2019-11-17 MED ORDER — BUPIVACAINE IN DEXTROSE 0.75-8.25 % IT SOLN
INTRATHECAL | Status: DC | PRN
  Administered 2019-11-17: 1.6 mL via INTRATHECAL

## 2019-11-17 MED ORDER — FLEET ENEMA 7-19 GM/118ML RE ENEM: 1.0000 | ENEMA | Freq: Every day | RECTAL | Status: DC | PRN

## 2019-11-17 MED ORDER — MORPHINE SULFATE (PF) 0.5 MG/ML IJ SOLN
INTRAMUSCULAR | Status: AC
  Filled 2019-11-17: qty 10

## 2019-11-17 MED ORDER — ZOLPIDEM TARTRATE 5 MG PO TABS: 5.0000 mg | ORAL_TABLET | Freq: Every evening | ORAL | Status: DC | PRN

## 2019-11-17 MED ORDER — COCONUT OIL OIL
1.0000 "application " | TOPICAL_OIL | Status: DC | PRN
  Administered 2019-11-18: 1 via TOPICAL

## 2019-11-17 MED ORDER — LACTATED RINGERS IV SOLN: INTRAVENOUS | Status: DC

## 2019-11-17 MED ORDER — MEPERIDINE HCL 25 MG/ML IJ SOLN: 6.2500 mg | INTRAMUSCULAR | Status: DC | PRN

## 2019-11-17 MED ORDER — PHENYLEPHRINE HCL-NACL 20-0.9 MG/250ML-% IV SOLN
INTRAVENOUS | Status: AC
  Filled 2019-11-17: qty 250

## 2019-11-17 MED ORDER — ONDANSETRON HCL 4 MG/2ML IJ SOLN
INTRAMUSCULAR | Status: AC
  Filled 2019-11-17: qty 2

## 2019-11-17 MED ORDER — FAMOTIDINE IN NACL 20-0.9 MG/50ML-% IV SOLN
INTRAVENOUS | Status: AC
  Filled 2019-11-17: qty 50

## 2019-11-17 MED ORDER — ONDANSETRON HCL 4 MG/2ML IJ SOLN
4.0000 mg | Freq: Three times a day (TID) | INTRAMUSCULAR | Status: DC | PRN
  Administered 2019-11-17: 4 mg via INTRAVENOUS

## 2019-11-17 MED ORDER — IBUPROFEN 800 MG PO TABS
800.0000 mg | ORAL_TABLET | Freq: Three times a day (TID) | ORAL | Status: DC
  Administered 2019-11-17 – 2019-11-20 (×8): 800 mg via ORAL
  Filled 2019-11-17 (×8): qty 1

## 2019-11-17 MED ORDER — MENTHOL 3 MG MT LOZG: 1.0000 | LOZENGE | OROMUCOSAL | Status: DC | PRN

## 2019-11-17 MED ORDER — FENTANYL CITRATE (PF) 100 MCG/2ML IJ SOLN
INTRAMUSCULAR | Status: AC
  Filled 2019-11-17: qty 2

## 2019-11-17 MED ORDER — SCOPOLAMINE 1 MG/3DAYS TD PT72
MEDICATED_PATCH | TRANSDERMAL | Status: AC
  Filled 2019-11-17: qty 1

## 2019-11-17 MED ORDER — NALOXONE HCL 4 MG/10ML IJ SOLN
1.0000 ug/kg/h | INTRAVENOUS | Status: DC | PRN
  Filled 2019-11-17: qty 5

## 2019-11-17 MED ORDER — OXYTOCIN-SODIUM CHLORIDE 30-0.9 UT/500ML-% IV SOLN: 2.5000 [IU]/h | INTRAVENOUS | Status: AC

## 2019-11-17 MED ORDER — TETANUS-DIPHTH-ACELL PERTUSSIS 5-2.5-18.5 LF-MCG/0.5 IM SUSP: 0.5000 mL | Freq: Once | INTRAMUSCULAR | Status: DC

## 2019-11-17 MED ORDER — DIPHENHYDRAMINE HCL 50 MG/ML IJ SOLN: 12.5000 mg | INTRAMUSCULAR | Status: DC | PRN

## 2019-11-17 MED ORDER — MEASLES, MUMPS & RUBELLA VAC IJ SOLR: 0.5000 mL | Freq: Once | INTRAMUSCULAR | Status: DC

## 2019-11-17 MED ORDER — HYDROMORPHONE HCL 1 MG/ML IJ SOLN: 0.2500 mg | INTRAMUSCULAR | Status: DC | PRN

## 2019-11-17 MED ORDER — LACTATED RINGERS IV BOLUS
1000.0000 mL | Freq: Once | INTRAVENOUS | Status: AC
  Administered 2019-11-17: 1000 mL via INTRAVENOUS

## 2019-11-17 MED ORDER — STERILE WATER FOR IRRIGATION IR SOLN
Status: DC | PRN
  Administered 2019-11-17: 1000 mL

## 2019-11-17 MED ORDER — EPHEDRINE SULFATE 50 MG/ML IJ SOLN
INTRAMUSCULAR | Status: DC | PRN
  Administered 2019-11-17: 10 mg via INTRAVENOUS

## 2019-11-17 MED ORDER — ONDANSETRON HCL 4 MG/2ML IJ SOLN
INTRAMUSCULAR | Status: DC | PRN
  Administered 2019-11-17: 4 mg via INTRAVENOUS

## 2019-11-17 MED ORDER — SIMETHICONE 80 MG PO CHEW: 80.0000 mg | CHEWABLE_TABLET | ORAL | Status: DC | PRN

## 2019-11-17 MED ORDER — METHYLERGONOVINE MALEATE 0.2 MG/ML IJ SOLN: 0.2000 mg | INTRAMUSCULAR | Status: DC | PRN

## 2019-11-17 MED ORDER — FERROUS SULFATE 325 (65 FE) MG PO TABS
325.0000 mg | ORAL_TABLET | Freq: Two times a day (BID) | ORAL | Status: DC
  Administered 2019-11-17 – 2019-11-19 (×5): 325 mg via ORAL
  Filled 2019-11-17 (×6): qty 1

## 2019-11-17 MED ORDER — FAMOTIDINE IN NACL 20-0.9 MG/50ML-% IV SOLN
20.0000 mg | Freq: Once | INTRAVENOUS | Status: AC
  Administered 2019-11-17: 20 mg via INTRAVENOUS

## 2019-11-17 MED ORDER — SIMETHICONE 80 MG PO CHEW
80.0000 mg | CHEWABLE_TABLET | ORAL | Status: DC
  Administered 2019-11-18 – 2019-11-19 (×3): 80 mg via ORAL
  Filled 2019-11-17 (×3): qty 1

## 2019-11-17 MED ORDER — NALBUPHINE HCL 10 MG/ML IJ SOLN: 5.0000 mg | INTRAMUSCULAR | Status: DC | PRN

## 2019-11-17 MED ORDER — SODIUM CHLORIDE 0.9 % IR SOLN
Status: DC | PRN
  Administered 2019-11-17: 1000 mL

## 2019-11-17 MED ORDER — DIPHENHYDRAMINE HCL 25 MG PO CAPS
25.0000 mg | ORAL_CAPSULE | ORAL | Status: DC | PRN
  Administered 2019-11-18: 25 mg via ORAL
  Filled 2019-11-17: qty 1

## 2019-11-17 MED ORDER — METHYLERGONOVINE MALEATE 0.2 MG PO TABS: 0.2000 mg | ORAL_TABLET | ORAL | Status: DC | PRN

## 2019-11-17 MED ORDER — BISACODYL 10 MG RE SUPP
10.0000 mg | Freq: Every day | RECTAL | Status: DC | PRN
  Administered 2019-11-19: 10 mg via RECTAL
  Filled 2019-11-17: qty 1

## 2019-11-17 MED ORDER — CEFAZOLIN SODIUM-DEXTROSE 2-3 GM-%(50ML) IV SOLR
INTRAVENOUS | Status: DC | PRN
  Administered 2019-11-17: 2 g via INTRAVENOUS

## 2019-11-17 MED ORDER — FENTANYL CITRATE (PF) 100 MCG/2ML IJ SOLN
50.0000 ug | INTRAMUSCULAR | Status: DC | PRN
  Administered 2019-11-17: 50 ug via INTRAVENOUS
  Filled 2019-11-17: qty 2

## 2019-11-17 MED ORDER — NALBUPHINE HCL 10 MG/ML IJ SOLN: 5.0000 mg | Freq: Once | INTRAMUSCULAR | Status: DC | PRN

## 2019-11-17 MED ORDER — SODIUM CHLORIDE 0.9 % IV SOLN: INTRAVENOUS | Status: DC | PRN

## 2019-11-17 MED ORDER — PRENATAL MULTIVITAMIN CH
1.0000 | ORAL_TABLET | Freq: Every day | ORAL | Status: DC
  Administered 2019-11-18 – 2019-11-19 (×2): 1 via ORAL
  Filled 2019-11-17 (×3): qty 1

## 2019-11-17 MED ORDER — KETOROLAC TROMETHAMINE 30 MG/ML IJ SOLN
30.0000 mg | Freq: Once | INTRAMUSCULAR | Status: AC | PRN
  Administered 2019-11-17: 30 mg via INTRAVENOUS

## 2019-11-17 MED ORDER — MORPHINE SULFATE (PF) 0.5 MG/ML IJ SOLN
INTRAMUSCULAR | Status: DC | PRN
Start: 2019-11-17 — End: 2019-11-17
  Administered 2019-11-17: .15 ug via INTRATHECAL

## 2019-11-17 MED ORDER — LACTATED RINGERS IV SOLN: INTRAVENOUS | Status: DC | PRN

## 2019-11-17 MED ORDER — SIMETHICONE 80 MG PO CHEW
80.0000 mg | CHEWABLE_TABLET | Freq: Three times a day (TID) | ORAL | Status: DC
  Administered 2019-11-17 – 2019-11-19 (×6): 80 mg via ORAL
  Filled 2019-11-17 (×7): qty 1

## 2019-11-17 MED ORDER — OXYTOCIN-SODIUM CHLORIDE 30-0.9 UT/500ML-% IV SOLN
INTRAVENOUS | Status: DC | PRN
  Administered 2019-11-17: 30 [IU] via INTRAVENOUS

## 2019-11-17 MED ORDER — KETOROLAC TROMETHAMINE 30 MG/ML IJ SOLN
INTRAMUSCULAR | Status: AC
  Filled 2019-11-17: qty 1

## 2019-11-17 MED ORDER — SOD CITRATE-CITRIC ACID 500-334 MG/5ML PO SOLN
30.0000 mL | Freq: Once | ORAL | Status: AC
  Administered 2019-11-17: 30 mL via ORAL
  Filled 2019-11-17: qty 30

## 2019-11-17 MED ORDER — CEFAZOLIN SODIUM-DEXTROSE 2-4 GM/100ML-% IV SOLN
2.0000 g | INTRAVENOUS | Status: DC
  Filled 2019-11-17: qty 100

## 2019-11-17 MED ORDER — NALOXONE HCL 0.4 MG/ML IJ SOLN: 0.4000 mg | INTRAMUSCULAR | Status: DC | PRN

## 2019-11-17 MED ORDER — OXYCODONE-ACETAMINOPHEN 5-325 MG PO TABS
1.0000 | ORAL_TABLET | ORAL | Status: DC | PRN
  Administered 2019-11-17: 1 via ORAL
  Administered 2019-11-18 – 2019-11-19 (×10): 2 via ORAL
  Administered 2019-11-20: 1 via ORAL
  Administered 2019-11-20 (×2): 2 via ORAL
  Filled 2019-11-17 (×5): qty 2
  Filled 2019-11-17: qty 1
  Filled 2019-11-17 (×4): qty 2
  Filled 2019-11-17: qty 1
  Filled 2019-11-17 (×3): qty 2

## 2019-11-17 MED ORDER — SODIUM CHLORIDE 0.9% FLUSH: 3.0000 mL | INTRAVENOUS | Status: DC | PRN

## 2019-11-17 MED ORDER — DEXAMETHASONE SODIUM PHOSPHATE 4 MG/ML IJ SOLN
INTRAMUSCULAR | Status: DC | PRN
  Administered 2019-11-17: 10 mg via INTRAVENOUS

## 2019-11-17 MED ORDER — DIPHENHYDRAMINE HCL 25 MG PO CAPS: 25.0000 mg | ORAL_CAPSULE | Freq: Four times a day (QID) | ORAL | Status: DC | PRN

## 2019-11-17 MED ORDER — FENTANYL CITRATE (PF) 100 MCG/2ML IJ SOLN
INTRAMUSCULAR | Status: DC | PRN
  Administered 2019-11-17: 15 ug via INTRATHECAL

## 2019-11-17 MED ORDER — SENNOSIDES-DOCUSATE SODIUM 8.6-50 MG PO TABS
2.0000 | ORAL_TABLET | ORAL | Status: DC
  Administered 2019-11-18 – 2019-11-19 (×3): 2 via ORAL
  Filled 2019-11-17 (×3): qty 2

## 2019-11-17 MED ORDER — DEXAMETHASONE SODIUM PHOSPHATE 10 MG/ML IJ SOLN
INTRAMUSCULAR | Status: AC
  Filled 2019-11-17: qty 1

## 2019-11-17 MED ORDER — SCOPOLAMINE 1 MG/3DAYS TD PT72
1.0000 | MEDICATED_PATCH | Freq: Once | TRANSDERMAL | Status: AC
  Administered 2019-11-17: 1.5 mg via TRANSDERMAL

## 2019-11-17 SURGICAL SUPPLY — 35 items
BENZOIN TINCTURE PRP APPL 2/3 (GAUZE/BANDAGES/DRESSINGS) ×2 IMPLANT
CHLORAPREP W/TINT 26ML (MISCELLANEOUS) ×2 IMPLANT
CLAMP CORD UMBIL (MISCELLANEOUS) IMPLANT
CLOSURE STERI STRIP 1/2 X4 (GAUZE/BANDAGES/DRESSINGS) ×2 IMPLANT
CLOTH BEACON ORANGE TIMEOUT ST (SAFETY) ×2 IMPLANT
DRSG OPSITE POSTOP 4X10 (GAUZE/BANDAGES/DRESSINGS) ×2 IMPLANT
ELECT REM PT RETURN 9FT ADLT (ELECTROSURGICAL) ×2
ELECTRODE REM PT RTRN 9FT ADLT (ELECTROSURGICAL) ×1 IMPLANT
EXTRACTOR VACUUM BELL STYLE (SUCTIONS) IMPLANT
GAUZE SPONGE 4X4 12PLY STRL LF (GAUZE/BANDAGES/DRESSINGS) ×4 IMPLANT
GLOVE BIO SURGEON STRL SZ7 (GLOVE) ×2 IMPLANT
GLOVE BIOGEL PI IND STRL 7.0 (GLOVE) ×1 IMPLANT
GLOVE BIOGEL PI INDICATOR 7.0 (GLOVE) ×1
GOWN STRL REUS W/TWL LRG LVL3 (GOWN DISPOSABLE) ×4 IMPLANT
HEMOSTAT ARISTA ABSORB 3G PWDR (HEMOSTASIS) ×2 IMPLANT
KIT ABG SYR 3ML LUER SLIP (SYRINGE) IMPLANT
NEEDLE HYPO 25X5/8 SAFETYGLIDE (NEEDLE) IMPLANT
NS IRRIG 1000ML POUR BTL (IV SOLUTION) ×2 IMPLANT
PACK C SECTION WH (CUSTOM PROCEDURE TRAY) ×2 IMPLANT
PAD ABD 7.5X8 STRL (GAUZE/BANDAGES/DRESSINGS) ×2 IMPLANT
PAD OB MATERNITY 4.3X12.25 (PERSONAL CARE ITEMS) ×2 IMPLANT
PENCIL SMOKE EVAC W/HOLSTER (ELECTROSURGICAL) ×2 IMPLANT
RTRCTR C-SECT PINK 25CM LRG (MISCELLANEOUS) ×2 IMPLANT
STRIP CLOSURE SKIN 1/2X4 (GAUZE/BANDAGES/DRESSINGS) ×2 IMPLANT
SUT MNCRL 0 VIOLET CTX 36 (SUTURE) ×2 IMPLANT
SUT MONOCRYL 0 CTX 36 (SUTURE) ×4
SUT PLAIN 2 0 XLH (SUTURE) IMPLANT
SUT VIC AB 0 CT1 27 (SUTURE) ×4
SUT VIC AB 0 CT1 27XBRD ANBCTR (SUTURE) ×2 IMPLANT
SUT VIC AB 2-0 CT1 27 (SUTURE) ×2
SUT VIC AB 2-0 CT1 TAPERPNT 27 (SUTURE) ×1 IMPLANT
SUT VIC AB 4-0 KS 27 (SUTURE) ×2 IMPLANT
TOWEL OR 17X24 6PK STRL BLUE (TOWEL DISPOSABLE) ×2 IMPLANT
TRAY FOLEY W/BAG SLVR 14FR LF (SET/KITS/TRAYS/PACK) ×2 IMPLANT
WATER STERILE IRR 1000ML POUR (IV SOLUTION) ×2 IMPLANT

## 2019-11-17 NOTE — Lactation Note (Signed)
This note was copied from a baby's chart. Lactation Consultation Note  Patient Name: Ruth Gross VHQIO'N Date: 11/17/2019 Reason for consult: Initial assessment;Early term 37-38.6wks P4, 12 hour ETI female infant in NICU.  Per mom, she BF her 1st child for one week, 2nd child for 4 months and 3rd child only few days due latch issues and low milk supply. LC discussed hand expression and mom easily expressed 2 mls in bullet and she was expressing colostrum in DEBP, dad will take EBM to NICU when mom is finished pumping. Mom will follow NICU infant feeding guidelines and policy. Mom will use DEBP every 3 hours for 15 minutes on initial setting with hands on pumping and will hand express after using DEBP to help with establishing her milk supply due to infant separation ( infant in NICU).  Mom shown how to use DEBP & how to disassemble, clean, & reassemble parts. Mom knows to call [facility] services if she has any further questions or concerns. Mom made aware of O/P services, breastfeeding support groups, community resources, and our phone # for post-discharge questions.    Maternal Data Formula Feeding for Exclusion: Yes Reason for exclusion: Mother's choice to formula and breast feed on admission Has patient been taught Hand Expression?: Yes Does the patient have breastfeeding experience prior to this delivery?: Yes  Feeding    LATCH Score                   Interventions Interventions: Breast feeding basics reviewed;Breast massage;Hand express;DEBP;Expressed milk  Lactation Tools Discussed/Used WIC Program: Yes Pump Review: Setup, frequency, and cleaning;Milk Storage Initiated by:: Danelle Earthly, IBCLC Date initiated:: 11/17/19   Consult Status Consult Status: Follow-up Date: 11/18/19 Follow-up type: In-patient    Danelle Earthly 11/17/2019, 10:27 PM

## 2019-11-17 NOTE — Transfer of Care (Signed)
Immediate Anesthesia Transfer of Care Note  Patient: Ruth Gross  Procedure(s) Performed: CESAREAN SECTION (N/A )  Patient Location: PACU  Anesthesia Type:Spinal  Level of Consciousness: awake  Airway & Oxygen Therapy: Patient Spontanous Breathing  Post-op Assessment: Report given to RN and Post -op Vital signs reviewed and stable  Post vital signs: Reviewed and stable  Last Vitals:  Vitals Value Taken Time  BP 105/63 11/17/19 1120  Temp    Pulse 60 11/17/19 1123  Resp 15 11/17/19 1123  SpO2 100 % 11/17/19 1123  Vitals shown include unvalidated device data.  Last Pain:  Vitals:   11/17/19 0639  TempSrc:   PainSc: 8          Complications: No complications documented.

## 2019-11-17 NOTE — MAU Note (Signed)
Covid swab obtained without difficulty and pt tol well. No symptoms 

## 2019-11-17 NOTE — MAU Note (Signed)
Pt presents to MAU reporting contractions since 2230 this evening. States they are every 4-5 minutes apart. Pt denies SROM, no vaginal bleeding or bloody show. Endorses + fetal movement. Pt has a scheduled CS for 11/23/2019 -pt has had 3 previous CS. 1st CS arrest of dilatation and non reassuring FHR tracing followed by 2 repeats. Pt states she is having a female child.Pt rates contractions at 6 on 1-10 pain scale

## 2019-11-17 NOTE — MAU Note (Signed)
Dr. Claiborne Billings made final decision to proceed with C/S at 0645. Unsure of time due to Type and Screen and COVID in processing.  OB Anesthesia notified and report given at 0655.  OR Press photographer notified at [phone]. Charge nurse had no further questions.  Womens AC notified at 0700.

## 2019-11-17 NOTE — Anesthesia Preprocedure Evaluation (Addendum)
Anesthesia Evaluation  Patient identified by MRN, date of birth, ID band Patient awake    Reviewed: Allergy & Precautions, NPO status , Patient's Chart, lab work & pertinent test results  Airway Mallampati: II  TM Distance: >3 FB Neck ROM: Full    Dental no notable dental hx. (+) Dental Advisory Given, Teeth Intact   Pulmonary asthma ,    Pulmonary exam normal breath sounds clear to auscultation- rhonchi       Cardiovascular Normal cardiovascular exam Rhythm:Regular Rate:Normal     Neuro/Psych  Headaches, Depression    GI/Hepatic Neg liver ROS, GERD  Medicated,  Endo/Other  negative endocrine ROS  Renal/GU negative Renal ROS  negative genitourinary   Musculoskeletal negative musculoskeletal ROS (+)   Abdominal   Peds  Hematology  (+) anemia ,   Anesthesia Other Findings   Reproductive/Obstetrics (+) Pregnancy Previous C/Section x 2 SROM                            Anesthesia Physical  Anesthesia Plan  ASA: II  Anesthesia Plan: Spinal   Post-op Pain Management:    Induction:   PONV Risk Score and Plan: 4 or greater and Scopolamine patch - Pre-op, Ondansetron, Dexamethasone and Treatment may vary due to age or medical condition  Airway Management Planned: Natural Airway  Additional Equipment: None  Intra-op Plan:   Post-operative Plan:   Informed Consent: I have reviewed the patients History and Physical, chart, labs and discussed the procedure including the risks, benefits and alternatives for the proposed anesthesia with the patient or authorized representative who has indicated his/her understanding and acceptance.     Dental advisory given  Plan Discussed with: CRNA  Anesthesia Plan Comments:        Anesthesia Quick Evaluation

## 2019-11-17 NOTE — Anesthesia Procedure Notes (Signed)
Spinal  Patient location during procedure: OR Staffing Performed: anesthesiologist  Anesthesiologist: Nolon Nations, MD Preanesthetic Checklist Completed: patient identified, IV checked, site marked, risks and benefits discussed, surgical consent, monitors and equipment checked, pre-op evaluation and timeout performed Spinal Block Patient position: sitting Prep: DuraPrep and site prepped and draped Patient monitoring: heart rate, continuous pulse ox and blood pressure Approach: midline Location: L3-4 Injection technique: single-shot Needle Needle type: Sprotte  Needle gauge: 24 G Needle length: 9 cm Additional Notes Expiration date of kit checked and confirmed. Patient tolerated procedure well, without complications.

## 2019-11-17 NOTE — H&P (Signed)
24 y.o.  [id number] [redacted]w[redacted]d comes in with latent labor and pt is for a repeat cesarean section at term.  Patient has good fetal movement and no bleeding.    Past Medical History:  Diagnosis Date  . Anemia   . Asthma   . Headache   . Heart problem    Pt states she had a "heart attack" in September.  [facility] ED note indicated discharged after normal testing with flexeril and naproxen.    Marland Kitchen Hx of chlamydia infection   . Hx of pyelonephritis during pregnancy 04/2016  . Pyelonephritis affecting pregnancy   . Vertigo     Past Surgical History:  Procedure Laterality Date  . CESAREAN SECTION N/A 02/10/2013   Procedure: CESAREAN SECTION;  Surgeon: Levi Aland, MD;  Location: [facility] ORS;  Service: Obstetrics;  Laterality: N/A;  . CESAREAN SECTION N/A 09/02/2016   Procedure: CESAREAN SECTION;  Surgeon: Essie Hart, MD;  Location: [facility] BIRTHING SUITES;  Service: Obstetrics;  Laterality: N/A;  . CESAREAN SECTION N/A 01/14/2018   Procedure: REPEAT CESAREAN SECTION;  Surgeon: Marlow Baars, MD;  Location: [facility] BIRTHING SUITES;  Service: Obstetrics;  Laterality: N/A;  RNFA AVAILABLE  . DILATION AND EVACUATION  04/10/2012   Procedure: DILATATION AND EVACUATION;  Surgeon: Mickel Baas, MD;  Location: [facility] ORS;  Service: Gynecology;  Laterality: N/A;  . DILATION AND EVACUATION N/A 06/29/2012   Procedure: DILATATION AND EVACUATION;  Surgeon: Mickel Baas, MD;  Location: [facility] ORS;  Service: Gynecology;  Laterality: N/A;    OB History  Gravida Para Term Preterm AB Living  5 3 3   1 3   SAB TAB Ectopic Multiple Live Births  1     0 3    # Outcome Date GA Lbr Len/2nd Weight Sex Delivery Anes PTL Lv  5 Current           4 Term 2019     CS-LTranv     3 Term 09/02/16 [redacted]w[redacted]d  3415 g M CS-Vac Spinal  LIV  2 Term 02/10/13 [redacted]w[redacted]d  3340 g F CS-LTranv EPI  LIV  1 SAB 04/09/12            Social History   Socioeconomic History  . Marital status: Single    Spouse name: Not on file  . Number of children: Not on file  .  Years of education: Not on file  . Highest education level: Not on file  Occupational History  . Not on file  Tobacco Use  . Smoking status: Never Smoker  . Smokeless tobacco: Never Used  Vaping Use  . Vaping Use: Never used  Substance and Sexual Activity  . Alcohol use: Yes    Comment: occasionally  . Drug use: No  . Sexual activity: Yes    Comment: pregnant  Other Topics Concern  . Not on file  Social History Narrative  . Not on file   Social Determinants of Health   Financial Resource Strain:   . Difficulty of Paying Living Expenses: Not on file  Food Insecurity:   . Worried About 04/11/12 in the Last Year: Not on file  . Ran Out of Food in the Last Year: Not on file  Transportation Needs:   . Lack of Transportation (Medical): Not on file  . Lack of Transportation (Non-Medical): Not on file  Physical Activity:   . Days of Exercise per Week: Not on file  . Minutes of Exercise per Session: Not on file  Stress:   .  Feeling of Stress : Not on file  Social Connections:   . Frequency of Communication with Friends and Family: Not on file  . Frequency of Social Gatherings with Friends and Family: Not on file  . Attends Religious Services: Not on file  . Active Member of Clubs or Organizations: Not on file  . Attends Banker Meetings: Not on file  . Marital Status: Not on file  Intimate Partner Violence:   . Fear of Current or Ex-Partner: Not on file  . Emotionally Abused: Not on file  . Physically Abused: Not on file  . Sexually Abused: Not on file   Patient has no known allergies.   Prenatal Course: Late entry into prenatal care.   Prenatal Transfer Tool  Maternal Diabetes: No Genetic Screening: Declined Maternal Ultrasounds/Referrals: Normal Fetal Ultrasounds or other Referrals:  None Maternal Substance Abuse:  No Significant Maternal Medications:  None Significant Maternal Lab Results: Group B Strep negative  Vitals:   11/17/19 0342  11/17/19 0355 11/17/19 0405  BP: 126/76  118/64  Pulse: 91  (!) 110  Resp: 17    Temp: 98.1 F (36.7 C)    TempSrc: Oral    SpO2: 100% 99% 99%  Weight: 86.6 kg    Height: 5\' 4"  (1.626 m)      Lungs/Cor:  NAD Abdomen:  soft, gravid Ex:  no cords, erythema SVE: 1/50/-2 FHTs:  120s, gSTV, NST R  A/P   For repeat cesarean sectionat term.  No BTL planned.  All risks, benefits and alternatives discussed with patient and she desires to proceed.  02-24-1999

## 2019-11-17 NOTE — MAU Provider Note (Signed)
S: [name] is a 24 y.o. [id number] at [redacted]w[redacted]d  who presents to MAU today for labor evaluation.   Patient has a history of C/S x 3.  Rates pain a "6"  Cervical exam by RN:  Dilation: 1 Effacement (%): 70 Cervical Position: Posterior, Middle Station: -2 Presentation: Vertex Exam by:: Ginnie Smart, RN SVE an hour later was unchanged.  Fetal Monitoring: Baseline: 135 Variability: average Accelerations: Present Decelerations: Absent Contractions: 2-61min  MDM Discussed patient with RN. NST reviewed.  Discussed with Dr Claiborne Billings.  Since patient is contracting regularly and having pain, even though her cervix has not changed, she want [place] to observe her until 6:30 and call her back to eval for Cesarean Delivery vs discharge home.   At 6:40, RN contacted Dr Claiborne Billings to update.  Decision was made to proceed with C/S  A: SIUP at [redacted]w[redacted]d  Labor Prior C/S x 3  P: Prep for repeat C/S   Aviva Signs, CNM 11/17/2019 6:07 AM

## 2019-11-17 NOTE — Op Note (Signed)
11/17/2019  11:01 AM  PATIENT:  Ruth Gross  24 y.o. female  PRE-OPERATIVE DIAGNOSIS:  repeat c/s, in labor  POST-OPERATIVE DIAGNOSIS:  repeat c/s, in labor  PROCEDURE:  Procedure(s): CESAREAN SECTION (N/A)  SURGEON:  Surgeon(s) and Role:    * Carrington Clamp, MD - Primary  ANESTHESIA:   spinal  EBL:  <300 cc  LOCAL MEDICATIONS USED:  NONE  SPECIMEN:  No Specimen  DISPOSITION OF SPECIMEN:  N/A  COUNTS:  YES  TOURNIQUET:  * No tourniquets in log *  DICTATION: .Note written in EPIC  PLAN OF CARE: Admit to inpatient   PATIENT DISPOSITION:  PACU - hemodynamically stable.   Delay start of Pharmacological VTE agent (>24hrs) due to surgical blood loss or risk of bleeding: not applicable  Complications:  None. Medications:  Ancef, Pitocin Findings:  Baby female, Apgars 8,8, weight P.   Normal tubes, ovaries and uterus seen.  Baby was skin to skin with mother after birth in the OR.  Technique:  After adequate spinal anesthesia was achieved, the patient was prepped and draped in usual sterile fashion.  A foley catheter was used to drain the bladder.  A pfannanstiel incision was made with the scalpel and carried down to the fascia with the bovie cautery. The fascia was incised in the midline with the scalpel and carried in a transverse curvilinear manner bilaterally.  The fascia was reflected superiorly and inferiorly off the rectus muscles and the muscles split in the midline.  A bowel free portion of the peritoneum was entered bluntly and then extended in a superior and inferior manner with good visualization of the bowel and bladder.  The Alexis instrument was then placed and the vesico-uterine fascia tented up and incised in a transverse curvilinear manner.  A 2 cm transverse incision was made in the upper portion of the lower uterine segment until the amnion was exposed.   The incision was extended transversely in a blunt manner.  Clear fluid was noted and the baby delivered  in the vertex presentation without complication.  The baby was bulb suctioned and the cord was clamped and cut aftet stripping blood from cord into baby.  The baby was then handed to awaiting Neonatology.  The placenta was then delivered manually and the uterus cleared of all debris.  The uterine incision was then closed with a running lock stitch of 0 monocryl.  An imbricating layer of 0 monocryl was closed as well. Excellent hemostasis of the uterine incision was achieved and the abdomen was cleared with irrigation.  The peritoneum was closed with a running stitch of 2-0 vicryl.  This incorporated the rectus muscles as a separate layer and several additional stitches were done to reapproximate the muscle.  The fascia was then closed with a running stitch of 0 vicryl.  The subcutaneous layer was closed with interrupted  stitches of 2-0 plain gut.  The skin trimmed and then was closed with 4-0 vicryl on a Keith needle and steri-strips.  The patient tolerated the procedure well and was returned to the recovery room in stable condition.  All counts were correct times three.  Loney Laurence

## 2019-11-17 NOTE — Anesthesia Postprocedure Evaluation (Signed)
Anesthesia Post Note  Patient: EDELMIRA GALLOGLY  Procedure(s) Performed: CESAREAN SECTION (N/A )     Patient location during evaluation: PACU Anesthesia Type: Spinal Level of consciousness: oriented and awake and alert Pain management: pain level controlled Vital Signs Assessment: post-procedure vital signs reviewed and stable Respiratory status: spontaneous breathing, respiratory function stable and patient connected to nasal cannula oxygen Cardiovascular status: blood pressure returned to baseline and stable Postop Assessment: no headache, no backache and no apparent nausea or vomiting Anesthetic complications: no   No complications documented.  Last Vitals:  Vitals:   11/17/19 1435 11/17/19 1538  BP: 122/70 116/70  Pulse: 60 65  Resp: 20 19  Temp:  36.6 C  SpO2: 100% 100%    Last Pain:  Vitals:   11/17/19 1538  TempSrc: Oral  PainSc: 0-No pain   Pain Goal:                   Lewie Loron

## 2019-11-17 NOTE — Brief Op Note (Signed)
11/17/2019  11:01 AM  PATIENT:  Ruth Gross  24 y.o. female  PRE-OPERATIVE DIAGNOSIS:  repeat c/s, in labor  POST-OPERATIVE DIAGNOSIS:  repeat c/s, in labor  PROCEDURE:  Procedure(s): CESAREAN SECTION (N/A)  SURGEON:  Surgeon(s) and Role:    * Carrington Clamp, MD - Primary  ANESTHESIA:   spinal  EBL:  <300 cc  LOCAL MEDICATIONS USED:  NONE  SPECIMEN:  No Specimen  DISPOSITION OF SPECIMEN:  N/A  COUNTS:  YES  TOURNIQUET:  * No tourniquets in log *  DICTATION: .Note written in EPIC  PLAN OF CARE: Admit to inpatient   PATIENT DISPOSITION:  PACU - hemodynamically stable.   Delay start of Pharmacological VTE agent (>24hrs) due to surgical blood loss or risk of bleeding: not applicable

## 2019-11-18 LAB — CBC
HCT: 27.7 % — ABNORMAL LOW (ref 36.0–46.0)
Hemoglobin: 8.4 g/dL — ABNORMAL LOW (ref 12.0–15.0)
MCH: 22.8 pg — ABNORMAL LOW (ref 26.0–34.0)
MCHC: 30.3 g/dL (ref 30.0–36.0)
MCV: 75.1 fL — ABNORMAL LOW (ref 80.0–100.0)
Platelets: 250 10*3/uL (ref 150–400)
RBC: 3.69 MIL/uL — ABNORMAL LOW (ref 3.87–5.11)
RDW: 14.6 % (ref 11.5–15.5)
WBC: 13.7 10*3/uL — ABNORMAL HIGH (ref 4.0–10.5)
nRBC: 0 % (ref 0.0–0.2)

## 2019-11-18 MED ORDER — DIBUCAINE (PERIANAL) 1 % EX OINT: 1.0000 "application " | TOPICAL_OINTMENT | CUTANEOUS | Status: DC | PRN

## 2019-11-18 MED ORDER — WITCH HAZEL-GLYCERIN EX PADS: 1.0000 "application " | MEDICATED_PAD | CUTANEOUS | Status: DC | PRN

## 2019-11-18 NOTE — Lactation Note (Signed)
Lactation Consultation Note  Patient Name: SHERRYANN FRESE EHOZY'Y Date: 11/18/2019      [facility] visited with Mom whose infant is in the NICU. Mom states she has been pumping 3x time with the DEBP but only able to get a few drops in a snappie. Mom states she is taking any colostrum collected to the NICU.   LC asked Mom about nipple pain and she states she has some around the areolae.  LC noted a small white bleb on the outer quadrant of the left nipple and swelling.  LC increased the flange size to 27 but as a result of the bleb on the right nipple it was still uncomfortable. We then used the 30 and switched to the manual. Mom had more let down with the manual pump and stated she felt comfortable with the increase in flange size.   LC alerted the RN Mom will need some coconut oil for nipple care. She can also use the coconut oil inside the flange before applying it. RN stated she would provide the coconut oil and go over with her how to apply it.   Mom will take collected colostrum to the NICU, RN aware.   Plan 1. Increase flange size to 30 and continue using DEPB to pump every 3 hours for 15 minutes.           2. Mom also has the manual pump that she can use to assist with let down for about 10 minutes if needed.

## 2019-11-18 NOTE — Progress Notes (Signed)
  Patient is eating, ambulating, voiding.  Pain control is good.  Vitals:   11/18/19 0051 11/18/19 0230 11/18/19 0431 11/18/19 0501  BP:    (!) 99/55  Pulse:    (!) 59  Resp: 16 16 16 16   Temp:    99.2 F (37.3 C)  TempSrc:    Oral  SpO2: 99% 100% 100% 99%  Weight:      Height:        lungs:   clear to auscultation cor:    RRR Abdomen:  soft, appropriate tenderness, incisions intact and without erythema or exudate ex:    no cords   Lab Results  Component Value Date   WBC 13.7 (H) 11/18/2019   HGB 8.4 (L) 11/18/2019   HCT 27.7 (L) 11/18/2019   MCV 75.1 (L) 11/18/2019   PLT 250 11/18/2019    --/--/O POS (09/03 0700)/RI  A/P    Post operative day 1.  Routine post op and postpartum care.  Expect d/c tomorrow.  Percocet for pain control. Iron.

## 2019-11-19 NOTE — Lactation Note (Signed)
This note was copied from a baby's chart. Lactation Consultation Note  Patient Name: Ruth Gross HYIFO'Y Date: 11/19/2019   [name] Ruth Gross now 75 hours old in the NICU.  Mom resting on arrival.  Mom reports starting to get more volume with pumping.  Mom reports she gets more milk with the manual pump.  Urged mom to use the DEBP for stimulation and hand expression and the manual breastpump for removal.   Mom reports she has an old breastpump at home but she is not sure where all the parts are.  Mom has Medicaid and is on [address] in [place].  Mom interested in getting DEBP from [facility].  [facility] referral sent. Discussed [employer] loaner pump since tomorrow is a holiday and mom may go home tomorrow.   Urged mom to pump 8-12 times day.  Urged mom to take home all of her pump parts including the manual pump. Praised pumping efforts.  Encouraged mom to call lactation as needed.   Maternal Data    Feeding Feeding Type: Breast Milk  LATCH Score                   Interventions    Lactation Tools Discussed/Used     Consult Status      Ruth Gross Michaelle Copas 11/19/2019, 2:05 PM

## 2019-11-19 NOTE — Progress Notes (Signed)
  Patient is eating, ambulating, voiding.  Pain control is good.  Vitals:   11/18/19 0431 11/18/19 0501 11/18/19 1502 11/18/19 1944  BP:  (!) 99/55 112/76 114/61  Pulse:  (!) 59 60 63  Resp: 16 16 18 18   Temp:  99.2 F (37.3 C) 97.8 F (36.6 C) 97.9 F (36.6 C)  TempSrc:  Oral Oral Oral  SpO2: 100% 99% 97% 100%  Weight:      Height:        lungs:   clear to auscultation cor:    RRR Abdomen:  soft, appropriate tenderness, incisions intact and without erythema or exudate ex:    no cords   Lab Results  Component Value Date   WBC 13.7 (H) 11/18/2019   HGB 8.4 (L) 11/18/2019   HCT 27.7 (L) 11/18/2019   MCV 75.1 (L) 11/18/2019   PLT 250 11/18/2019    --/--/O POS (09/03 0700)/RI  A/P    Post operative day 2.  Routine post op and postpartum care.  Expect d/c tomorrow as baby is in  NICU. Circ delayed.  Doing better.  Percocet for pain control. Iron.

## 2019-11-20 MED ORDER — IBUPROFEN 800 MG PO TABS: 800.0000 mg | ORAL_TABLET | Freq: Three times a day (TID) | ORAL | 0 refills | Status: DC

## 2019-11-20 NOTE — Discharge Summary (Signed)
Postpartum Discharge Summary      Patient Name: Ruth Gross DOB: 09/06/1995 MRN: [id number]  Date of admission: 11/17/2019 Delivery date:11/17/2019  Delivering provider: Carrington Clamp  Date of discharge: 11/20/2019  Admitting diagnosis: IUP at 38 weeks in labor, H.O. prior C/S x3     Discharge diagnosis:    IUP at 38 weeks in labor, H.O. prior C/S x3                                           Hospital course: Sceduled C/S   24 y.o. yo [id number] at [redacted]w[redacted]d was admitted to the hospital 11/17/2019 for scheduled cesarean section with the following indication:Elective Repeat.Delivery details are as follows:  Membrane Rupture Time/Date: 10:13 AM ,11/17/2019   Delivery Method:C-Section, Low Transverse  Details of operation can be found in separate operative note.  Patient had an uncomplicated postpartum course.  She is ambulating, tolerating a regular diet, passing flatus, and urinating well. Patient is discharged home in stable condition on  11/20/19        Newborn Data: Birth date:11/17/2019  Birth time:10:14 AM  Gender:Female  Living status:Living  Apgars:8 ,8  Weight:3080 g       Physical exam  Vitals:   11/19/19 0552 11/19/19 1500 11/19/19 2126 11/20/19 0555  BP: 106/68 107/67 118/61 115/70  Pulse: 73 71 87 71  Resp: 18 17 16 18   Temp: 98.2 F (36.8 C) 98.1 F (36.7 C) 98.8 F (37.1 C) 97.8 F (36.6 C)  TempSrc: Oral Axillary Oral Oral  SpO2: 99% 98% 100% 99%  Weight:      Height:       General: alert Lochia: appropriate  Labs: Lab Results  Component Value Date   WBC 13.7 (H) 11/18/2019   HGB 8.4 (L) 11/18/2019   HCT 27.7 (L) 11/18/2019   MCV 75.1 (L) 11/18/2019   PLT 250 11/18/2019   CMP Latest Ref Rng & Units 08/25/2019  Glucose 70 - 99 mg/dL 78  BUN 6 - 20 mg/dL 5(L)  Creatinine 10/25/2019 - 1.00 mg/dL 1.49  Sodium 7.02 - 637 mmol/L 136  Potassium 3.5 - 5.1 mmol/L 3.7  Chloride 98 - 111 mmol/L 105  CO2 22 - 32 mmol/L 23  Calcium 8.9 - 10.3 mg/dL 858)  Total  Protein 6.5 - 8.1 g/dL 7.1  Total Bilirubin 0.3 - 1.2 mg/dL 0.5  Alkaline Phos 38 - 126 U/L 85  AST 15 - 41 U/L 12(L)  ALT 0 - 44 U/L 10     After visit meds:  Allergies as of 11/20/2019   No Known Allergies     Medication List    STOP taking these medications   terconazole 0.4 % vaginal cream Commonly known as: TERAZOL 7     TAKE these medications   ibuprofen 800 MG tablet Commonly known as: ADVIL Take 1 tablet (800 mg total) by mouth every 8 (eight) hours.   prenatal multivitamin Tabs tablet Take 1 tablet by mouth daily.        Discharge home in stable condition  Infant Disposition:NICU Discharge instruction: per After Visit Summary and Postpartum booklet. Activity: Advance as tolerated. Pelvic rest for 6 weeks.  Diet: routine diet Future Appointments:No future appointments. Follow up Visit:  Follow-up Information    01/20/2020, MD. Schedule an appointment as soon as possible for a visit in 1 month(s).  Specialty: Obstetrics and Gynecology Contact information: [address] [place] [address] [phone]                   11/20/2019 Levi Aland, MD

## 2019-11-20 NOTE — Lactation Note (Addendum)
This note was copied from a baby's chart. Lactation Consultation Note  Patient Name: Ruth Gross [id number]'J Date: 11/20/2019 Reason for consult: Follow-up assessment;NICU baby;Early term 73-38.6wks Baby 73hrs old, wt loss 6%, mom and dad in room preparing for discharge, headed down to see baby in NICU. Mom reports plans to pump q3hrs, has a DEBP and hand pump for home, would like to latch baby to breast when NICU states ok to do so. Encouraged mom to pump q3hrs or based on baby's feeding schedule, inform NICU when ready to schedule a time with [facility] for latch assistance, call for [facility] telephone support as needed. Mom voiced understanding and with no further concerns. Left the room as RN entered for discharge teaching. BGilliam, RN, IBCLC  Addendum: Discussed engorgement and how to manage. BGilliam, RN, IBCLC  Maternal Data    Feeding Feeding Type: Formula  LATCH Score                   Interventions Interventions: Breast feeding basics reviewed;DEBP;Hand pump;Breast massage  Lactation Tools Discussed/Used     Consult Status Consult Status: Follow-up Date: 11/21/19 Follow-up type: In-patient    Charlynn Court 11/20/2019, 12:11 PM

## 2019-11-20 NOTE — Progress Notes (Signed)
POD#3 Pt without complaints. Baby still in NICU VSSAF Lochia mild IMP/ Stable  Plan/ Will discharge

## 2019-11-20 NOTE — Progress Notes (Signed)
Patient screened out for psychosocial assessment since none of the following apply:  Psychosocial stressors documented in mother or baby's chart  Gestation less than 32 weeks  Code at delivery   Infant with anomalies Please contact the Clinical Social Worker if specific needs arise, by MOB's request, or if MOB scores greater than 9/yes to question 10 on Edinburgh Postpartum Depression Screen.  Keygan Dumond, LCSW Clinical Social Worker [facility] Cell#: [phone]     

## 2019-11-21 ENCOUNTER — Other Ambulatory Visit (HOSPITAL_COMMUNITY): Payer: Medicaid Other

## 2019-11-21 ENCOUNTER — Other Ambulatory Visit (HOSPITAL_COMMUNITY): Admission: RE | Admit: 2019-11-21 | Payer: Medicaid Other | Source: Ambulatory Visit

## 2019-11-22 ENCOUNTER — Other Ambulatory Visit (HOSPITAL_COMMUNITY)
Admission: RE | Admit: 2019-11-22 | Discharge: 2019-11-22 | Disposition: A | Discharge status: Home / Self Care | Payer: Medicaid Other | Source: Ambulatory Visit | Attending: Obstetrics and Gynecology | Admitting: Obstetrics and Gynecology

## 2019-11-23 ENCOUNTER — Inpatient Hospital Stay (HOSPITAL_COMMUNITY)
Admission: RE | Admit: 2019-11-23 | Payer: Medicaid Other | Source: Home / Self Care | Admitting: Obstetrics and Gynecology

## 2019-11-23 ENCOUNTER — Encounter (HOSPITAL_COMMUNITY): Admission: RE | Payer: Self-pay | Source: Home / Self Care

## 2019-11-23 ENCOUNTER — Ambulatory Visit: Payer: Self-pay

## 2019-11-23 SURGERY — Surgical Case
Anesthesia: Regional

## 2019-11-23 NOTE — Lactation Note (Signed)
This note was copied from a baby's chart. Lactation Consultation Note  Patient Name: Ruth Gross [id number] Date: 11/23/2019 Reason for consult: Follow-up assessment;NICU baby;Early term 37-38.6wks  1340 - 1350 - I followed up with [name] and her 70 day old son, [name]. She has been bottle feeding Public house manager. She states that she may want to breast feed him later, but at this time, she prefers to pump and bottle feed him until he is "stronger."  Currently [name] is using the DEBP in the room, and reports that she pumps 1 ounce from each breast (60 mls combined). She uses a manual pump at home. She has WIC in [place]. I volunteered to put in a referral to help her obtain a pump from [facility].   [name] is going to follow up with the [facility]. I made her aware that they have lactation services.   I checked her pump settings, and she is using the initiation phase. I showed her how to use the maintenance setting.  I encouraged [name] to pump 8 times or more a day. She is trying to pump on baby's schedule.   Feeding Feeding Type: Bottle Fed - Breast Milk Nipple Type: Dr. Irving Burton Preemie  Interventions Interventions: Breast feeding basics reviewed;DEBP  Lactation Tools Discussed/Used WIC Program: Yes Pump Review: Setup, frequency, and cleaning   Consult Status Consult Status: Follow-up Date: 11/25/19    Walker Shadow 11/23/2019, 1:57 PM

## 2019-11-24 ENCOUNTER — Ambulatory Visit: Payer: Self-pay

## 2019-11-24 NOTE — Lactation Note (Addendum)
This note was copied from a baby's chart. Lactation Consultation Note  Patient Name: Ruth Gross [id number] Date: 11/24/2019 Reason for consult: Follow-up assessment;NICU baby;Early term 37-38.6wks  LC in to visit with P4 Mom of ET infant on day of discharge from the NICU.  Baby is 93 days old and is being exclusively bottle fed.  Mom is choosing to exclusively pump presently.  Mom only has a hand pump at home, but has WIC.  Mom is planning to call WIC today to pick up her pump.  Re-faxed referral to [facility] office.  Encouraged Mom to put baby STS as much as possible.  Mom is open to seeing if baby will latch to breast.  Mom aware of OP lactation support available to her.  Encouraged pumping >8 times per 24 hrs.  Mom expressing 30 ml on average when she pumps.  Mom denies any questions, but knows she can call prn.  Message sent to message to Soyla Dryer at [facility] to follow-up prn.  Interventions Interventions: Breast feeding basics reviewed;Skin to skin;Breast massage;Hand express;DEBP  Lactation Tools Discussed/Used Tools: Pump Breast pump type: Double-Electric Breast Pump WIC Program: Yes   Consult Status Consult Status: Complete Date: 11/24/19 Follow-up type: Call as needed    Ruth Gross 11/24/2019, 12:11 PM

## 2020-12-26 IMAGING — US US EXTREM LOW VENOUS*R*
1 series · 13 of 24 positions shown · non-contrast
Comparison: None.

CLINICAL DATA: Right lower extremity pain and edema.



[Series 1: us venous img lower uni right (dvt) · portal-venous · 47 acquisitions, 13 frames shown]
[im 1/47]
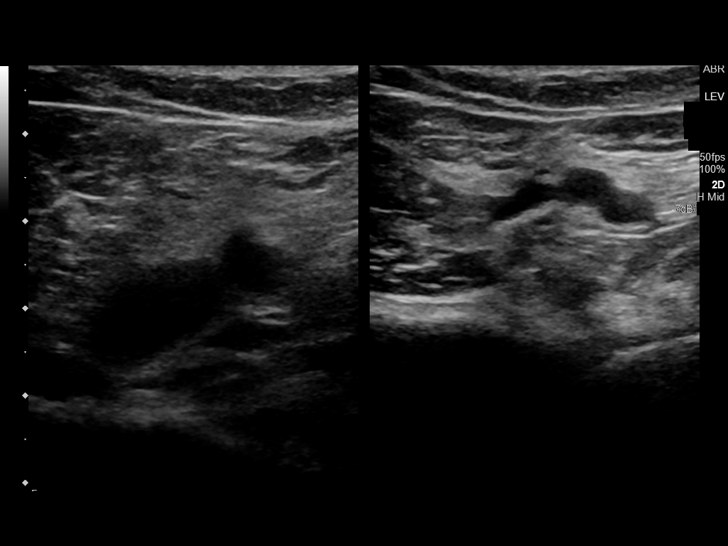
[im 5/47]
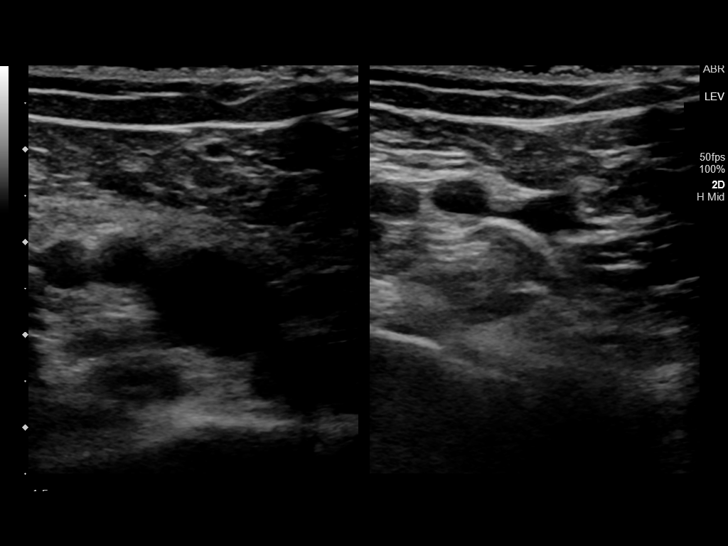
[im 9/47]
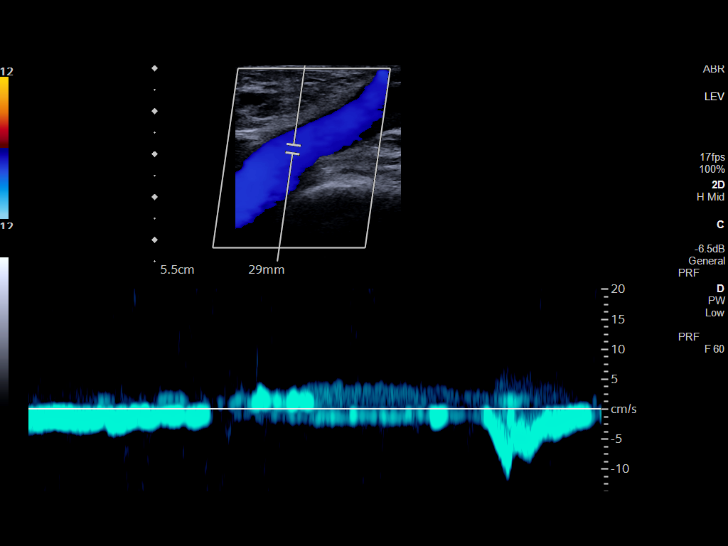
[im 13/47]
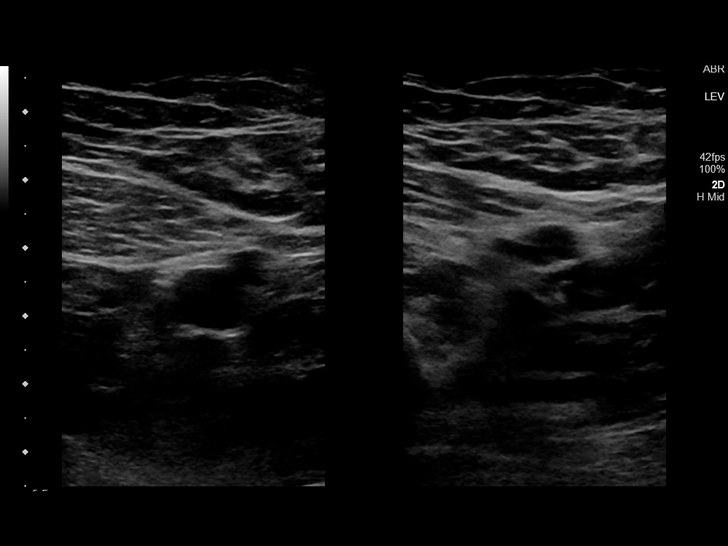
[im 17/47]
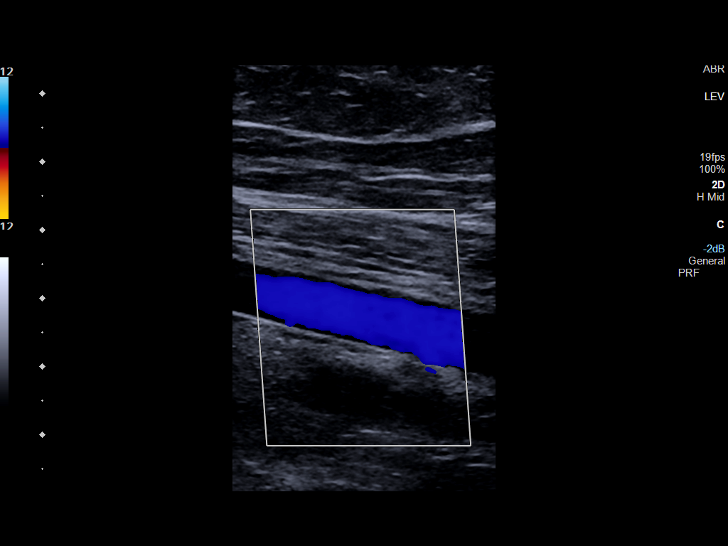
[im 21/47]
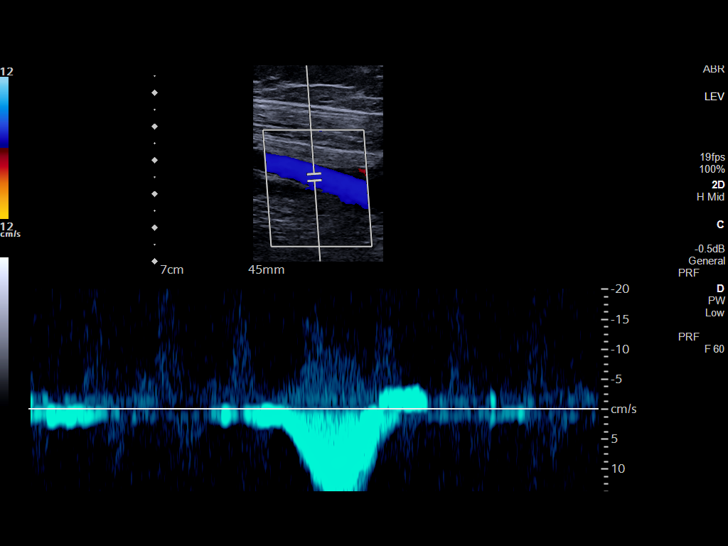
[im 27/47]
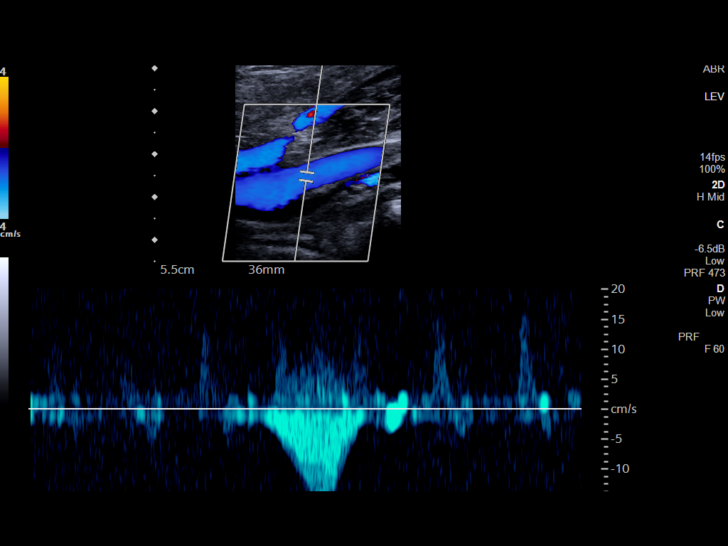
[im 29/47]
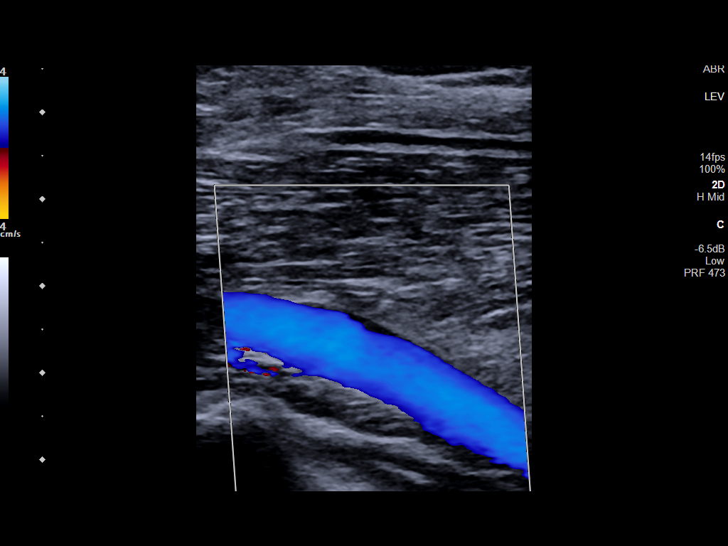
[im 33/47]
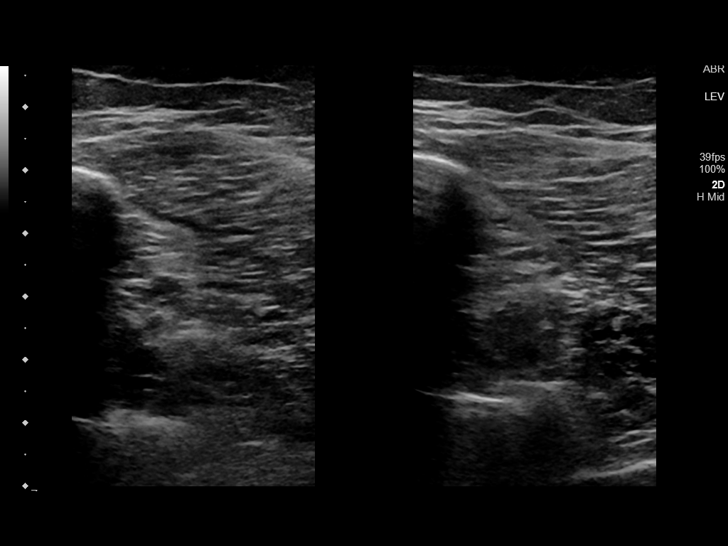
[im 37/47]
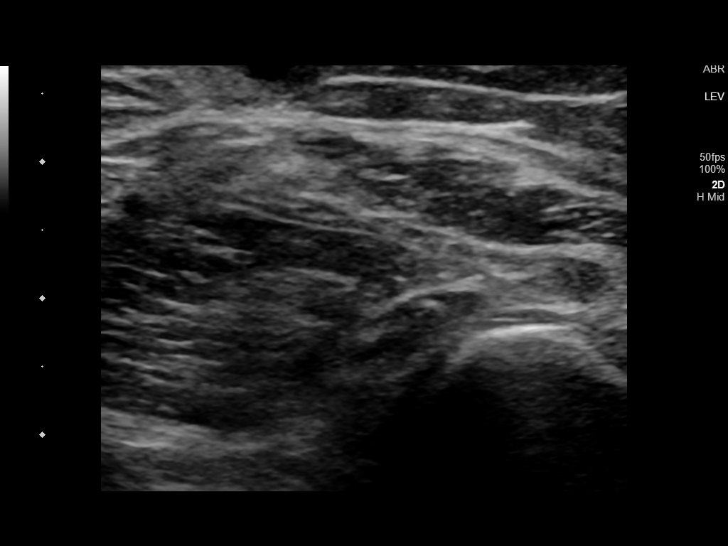
[im 41/47]
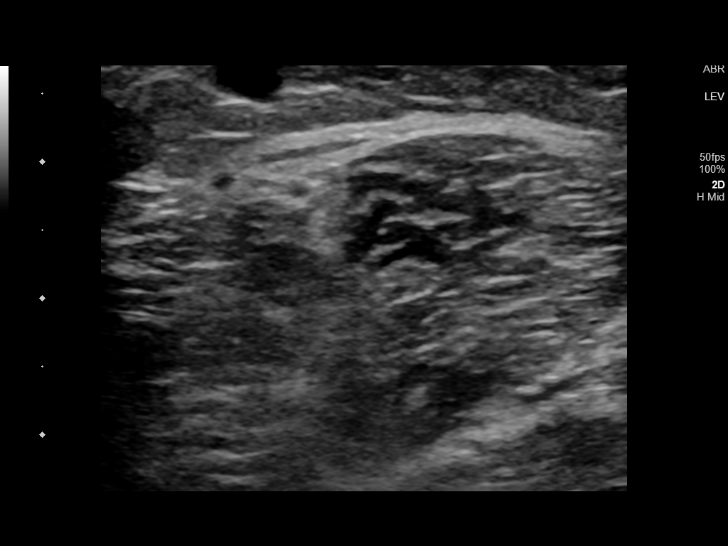
[im 45/47]
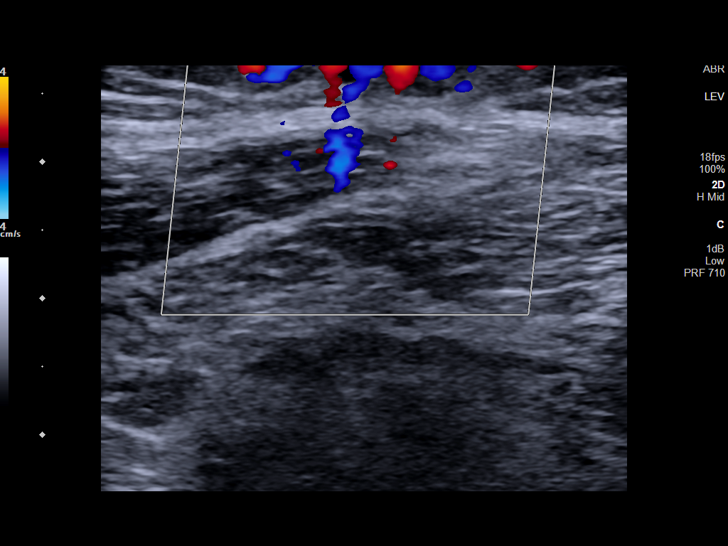
[im 47/47]
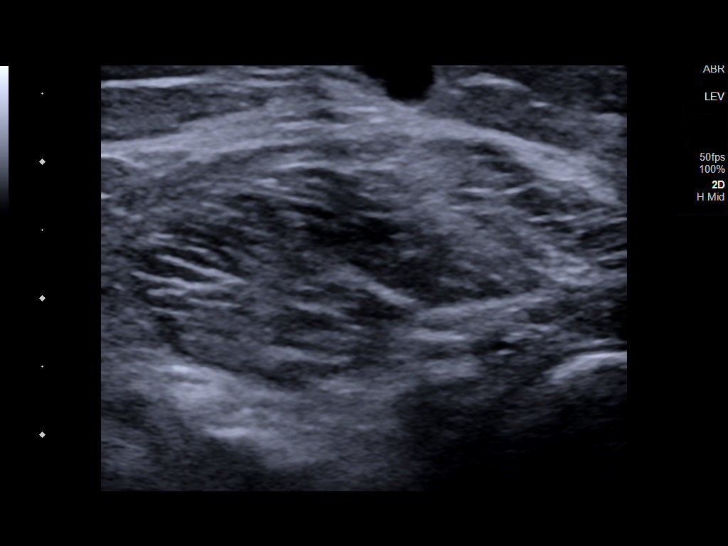

[13 of 24 positions shown; findings below may reference images not displayed]

FINDINGS: Contralateral Common Femoral Vein: Respiratory phasicity is normal
and symmetric with the symptomatic side. No evidence of thrombus.
Normal compressibility.

Common Femoral Vein: No evidence of thrombus. Normal
compressibility, respiratory phasicity and response to augmentation.

Saphenofemoral Junction: No evidence of thrombus. Normal
compressibility and flow on color Doppler imaging.

Profunda Femoral Vein: No evidence of thrombus. Normal
compressibility and flow on color Doppler imaging.

Femoral Vein: No evidence of thrombus. Normal compressibility,
respiratory phasicity and response to augmentation.

Popliteal Vein: No evidence of thrombus. Normal compressibility,
respiratory phasicity and response to augmentation.

Calf Veins: No evidence of thrombus. Normal compressibility and flow
on color Doppler imaging.

Superficial Great Saphenous Vein: No evidence of thrombus. Normal
compressibility.

Venous Reflux:  None.

Other Findings: There are some patent superficial varicosities in
the lateral right popliteal fossa and proximal calf region. No
evidence of superficial thrombophlebitis or abnormal fluid
collection.
IMPRESSION: 1. No evidence of right lower extremity deep venous thrombosis.
2. Patent varicosities at the level of the knee/proximal calf
without evidence of acute superficial thrombophlebitis.

## 2023-09-08 ENCOUNTER — Other Ambulatory Visit: Payer: Self-pay

## 2023-09-08 ENCOUNTER — Encounter: Payer: Self-pay | Admitting: *Deleted

## 2023-09-08 ENCOUNTER — Emergency Department
Admission: EM | Admit: 2023-09-08 | Discharge: 2023-09-09 | Disposition: A | Discharge status: Home / Self Care | Attending: Emergency Medicine | Admitting: Emergency Medicine

## 2023-09-08 DIAGNOSIS — M545 Low back pain, unspecified: Secondary | ICD-10-CM | POA: Diagnosis present

## 2023-09-08 DIAGNOSIS — R109 Unspecified abdominal pain: Secondary | ICD-10-CM | POA: Insufficient documentation

## 2023-09-08 DIAGNOSIS — R519 Headache, unspecified: Secondary | ICD-10-CM | POA: Insufficient documentation

## 2023-09-08 DIAGNOSIS — R112 Nausea with vomiting, unspecified: Secondary | ICD-10-CM | POA: Insufficient documentation

## 2023-09-08 LAB — BASIC METABOLIC PANEL WITH GFR
Anion gap: 6 (ref 5–15)
BUN: 11 mg/dL (ref 6–20)
CO2: 30 mmol/L (ref 22–32)
Calcium: 9.1 mg/dL (ref 8.9–10.3)
Chloride: 104 mmol/L (ref 98–111)
Creatinine, Ser: 0.65 mg/dL (ref 0.44–1.00)
GFR, Estimated: >60 mL/min (ref >60)
Glucose, Bld: 106 mg/dL — ABNORMAL HIGH (ref 70–99)
Potassium: 3.7 mmol/L (ref 3.5–5.1)
Sodium: 140 mmol/L (ref 135–145)

## 2023-09-08 LAB — CBC
HCT: 38.1 % (ref 36.0–46.0)
Hemoglobin: 12.7 g/dL (ref 12.0–15.0)
MCH: 28.5 pg (ref 26.0–34.0)
MCHC: 33.3 g/dL (ref 30.0–36.0)
MCV: 85.6 fL (ref 80.0–100.0)
Platelets: 293 10*3/uL (ref 150–400)
RBC: 4.45 MIL/uL (ref 3.87–5.11)
RDW: 13.6 % (ref 11.5–15.5)
WBC: 9.6 10*3/uL (ref 4.0–10.5)
nRBC: 0 % (ref 0.0–0.2)

## 2023-09-08 LAB — URINALYSIS, ROUTINE W REFLEX MICROSCOPIC
Bilirubin Urine: NEGATIVE
Glucose, UA: NEGATIVE mg/dL
Ketones, ur: NEGATIVE mg/dL
Leukocytes,Ua: NEGATIVE
Nitrite: NEGATIVE
Protein, ur: 100 mg/dL — AB
Specific Gravity, Urine: 1.021 (ref 1.005–1.030)
pH: 6 (ref 5.0–8.0)

## 2023-09-08 LAB — POC URINE PREG, ED: Preg Test, Ur: NEGATIVE

## 2023-09-08 NOTE — ED Triage Notes (Signed)
 Pt ambulatory to triage.  Pt has left lower back pain with vomiting.  Pt also reports a headache.  Sx began yesterday.  Pt denies dysuria.  No hx kidney stones.   Pt alert  speech clear.

## 2023-09-09 ENCOUNTER — Emergency Department

## 2023-09-09 MED ORDER — KETOROLAC TROMETHAMINE 60 MG/2ML IM SOLN
30.0000 mg | Freq: Once | INTRAMUSCULAR | Status: AC
  Administered 2023-09-09: 30 mg via INTRAMUSCULAR
  Filled 2023-09-09: qty 2

## 2023-09-09 NOTE — ED Provider Notes (Signed)
 [facility] Provider Note    Event Date/Time   First MD Initiated Contact with Patient 09/09/23 0000     (approximate)   History   Flank Pain and Headache   HPI Ruth Gross is a 28 y.o. female presenting today for left low back pain.  Patient reports onset of symptoms yesterday around her kidney.  She has had prior kidney infections and stated she thought it felt similar.  She was also having intermittent headaches as well as 1 episode of nausea with vomiting.  Denies any dysuria or hematuria.  No constipation or diarrhea.  No other abdominal pain symptoms.     Physical Exam   Triage Vital Signs: ED Triage Vitals [09/08/23 2206]  Encounter Vitals Group     BP (!) 142/71     Girls Systolic BP Percentile      Girls Diastolic BP Percentile      Boys Systolic BP Percentile      Boys Diastolic BP Percentile      Pulse Rate 95     Resp 18     Temp 98.5 F (36.9 C)     Temp Source Oral     SpO2 100 %     Weight 180 lb (81.6 kg)     Height 5' 4 (1.626 m)     Head Circumference      Peak Flow      Pain Score 7     Pain Loc      Pain Education      Exclude from Growth Chart     Most recent vital signs: Vitals:   09/08/23 2206  BP: (!) 142/71  Pulse: 95  Resp: 18  Temp: 98.5 F (36.9 C)  SpO2: 100%   Physical Exam: I have reviewed the vital signs and nursing notes. General: Awake, alert, no acute distress.  Nontoxic appearing. Head:  Atraumatic, normocephalic.   ENT:  EOM intact, PERRL. Oral mucosa is pink and moist with no lesions. Neck: Neck is supple with full range of motion, No meningeal signs. Cardiovascular:  RRR, No murmurs. Peripheral pulses palpable and equal bilaterally. Respiratory:  Symmetrical chest wall expansion.  No rhonchi, rales, or wheezes.  Good air movement throughout.  No use of accessory muscles.   Musculoskeletal:  No cyanosis or edema. Moving extremities with full ROM Abdomen:  Soft, nontender, nondistended.   CVA tenderness present on the left side. Neuro:  GCS 15, moving all four extremities, interacting appropriately. Speech clear. Psych:  Calm, appropriate.   Skin:  Warm, dry, no rash.    ED Results / Procedures / Treatments   Labs (all labs ordered are listed, but only abnormal results are displayed) Labs Reviewed  BASIC METABOLIC PANEL WITH GFR - Abnormal; Notable for the following components:      Result Value   Glucose, Bld 106 (*)    All other components within normal limits  URINALYSIS, ROUTINE W REFLEX MICROSCOPIC - Abnormal; Notable for the following components:   Color, Urine AMBER (*)    APPearance HAZY (*)    Hgb urine dipstick LARGE (*)    Protein, ur 100 (*)    Bacteria, UA RARE (*)    All other components within normal limits  CBC  POC URINE PREG, ED     EKG    RADIOLOGY Independently interpreted CT abdomen/pelvis with no acute findings although to me it appears there might be a stone lower down in the left side.  Not  reported by radiology.   PROCEDURES:  Critical Care performed: No  Procedures   MEDICATIONS ORDERED IN ED: Medications  ketorolac  (TORADOL ) injection 30 mg (30 mg Intramuscular Given 09/09/23 0034)     IMPRESSION / MDM / ASSESSMENT AND PLAN / ED COURSE  I reviewed the triage vital signs and the nursing notes.                              Differential diagnosis includes, but is not limited to, nephrolithiasis, pyelonephritis, acute cystitis, muscular strain, diverticulitis  Patient's presentation is most consistent with acute complicated illness / injury requiring diagnostic workup.  Patient is a 28 year old female presenting today for left flank pain associated with headache and vomiting.  Currently the symptoms are intermittent and milder.  Vital signs are stable.  Has left-sided CVA tenderness.  CBC and BMP unremarkable.  Negative pregnancy test.  UA without signs of infection but does have hemoglobin present.  CT renal stone  imaging for evaluation of possible kidney stone does not show any reason according to radiologist although I do see some radiopaque areas which might indicate a stone.  Patient was given Toradol  for symptoms.  Patient was reassessed with almost complete resolution of pain symptoms and no ongoing headache.  May have potentially passed a stone or have musculoskeletal pain causing the symptoms.  Either way, her workup is reassuring today with stable vital signs and exam.  Will discharge at this time with scheduled NSAIDs and Tylenol  and given strict return precautions.  Clinical Course as of 09/09/23 0133  Thu Sep 09, 2023  0130 Patient with almost complete resolution of pain symptoms following Toradol .  Will discharge at this time with otherwise reassuring workup [DW]    Clinical Course User Index [DW] Malvina Alm DASEN, MD     FINAL CLINICAL IMPRESSION(S) / ED DIAGNOSES   Final diagnoses:  Left flank pain     Rx / DC Orders   ED Discharge Orders     None        Note:  This document was prepared using Dragon voice recognition software and may include unintentional dictation errors.   Malvina Alm DASEN, MD 09/09/23 [phone]

## 2023-09-09 NOTE — Discharge Instructions (Signed)
 Your laboratory workup other than some blood in your urine was largely reassuring today.  CT imaging showed no acute findings.  You may have potentially passed a kidney stone earlier which can sometimes cause the symptoms.  I recommend you take ibuprofen  and Tylenol  every 6-8 hours over the next 24 to 48 hours to keep symptoms at bay.  Please return if you have any severe worsening of symptoms not controlled by those medications or develop any new or unusual symptoms like severe shortness of breath or chest pain.

## 2024-03-10 ENCOUNTER — Ambulatory Visit

## 2024-03-10 ENCOUNTER — Encounter: Payer: Self-pay | Admitting: Emergency Medicine

## 2024-03-10 ENCOUNTER — Ambulatory Visit
Admission: EM | Admit: 2024-03-10 | Discharge: 2024-03-10 | Disposition: A | Discharge status: Home / Self Care | Attending: Family Medicine | Admitting: Family Medicine

## 2024-03-10 DIAGNOSIS — M79642 Pain in left hand: Secondary | ICD-10-CM

## 2024-03-10 MED ORDER — PREDNISONE 20 MG PO TABS: 40.0000 mg | ORAL_TABLET | Freq: Every day | ORAL | 0 refills | Status: AC

## 2024-03-10 MED ORDER — DEXAMETHASONE SOD PHOSPHATE PF 10 MG/ML IJ SOLN: 10.0000 mg | Freq: Once | INTRAMUSCULAR | Status: DC

## 2024-03-10 NOTE — ED Provider Notes (Signed)
" [facility]    CSN: [id number] Arrival date & time: 03/10/24  1809      History   Chief Complaint No chief complaint on file.   HPI Ruth Gross is a 28 y.o. female.   Patient presenting today with 1 day history of dorsal left hand pain, stiffness that started yesterday.  Denies injury to the area, discoloration, numbness, tingling.  So far trying ibuprofen , Tylenol , ice with minimal relief.    Past Medical History:  Diagnosis Date   Anemia    Asthma    Headache    Heart problem    Pt states she had a heart attack in September.  [facility] ED note indicated discharged after normal testing with flexeril  and naproxen .     Hx of chlamydia infection    Hx of pyelonephritis during pregnancy 04/2016   Pyelonephritis affecting pregnancy    Vertigo     Patient Active Problem List   Diagnosis Date Noted   Postoperative state 11/17/2019   History of cesarean delivery 01/14/2018   Fall down stairs 12/28/2017   Status post repeat low transverse cesarean section 09/02/2016   Pyelonephritis 05/09/2016   Supervision of other normal pregnancy 02/11/2013   Pregnancy 02/09/2013   Anemia 06/16/2012   History of miscarriage 06/16/2012    Past Surgical History:  Procedure Laterality Date   CESAREAN SECTION N/A 02/10/2013   Procedure: CESAREAN SECTION;  Surgeon: Oneil FORBES Piety, MD;  Location: [facility] ORS;  Service: Obstetrics;  Laterality: N/A;   CESAREAN SECTION N/A 09/02/2016   Procedure: CESAREAN SECTION;  Surgeon: Bettina Muskrat, MD;  Location: [facility] BIRTHING SUITES;  Service: Obstetrics;  Laterality: N/A;   CESAREAN SECTION N/A 01/14/2018   Procedure: REPEAT CESAREAN SECTION;  Surgeon: Gretta Gums, MD;  Location: [facility] BIRTHING SUITES;  Service: Obstetrics;  Laterality: N/A;  RNFA AVAILABLE   CESAREAN SECTION N/A 11/17/2019   Procedure: CESAREAN SECTION;  Surgeon: Sarrah Browning, MD;  Location: [facility] LD ORS;  Service: Obstetrics;  Laterality: N/A;   DILATION AND EVACUATION   04/10/2012   Procedure: DILATATION AND EVACUATION;  Surgeon: Charlie BIRCH Aho, MD;  Location: [facility] ORS;  Service: Gynecology;  Laterality: N/A;   DILATION AND EVACUATION N/A 06/29/2012   Procedure: DILATATION AND EVACUATION;  Surgeon: Charlie BIRCH Aho, MD;  Location: [facility] ORS;  Service: Gynecology;  Laterality: N/A;    OB History     Gravida  5   Para  4   Term  4   Preterm      AB  1   Living  4      SAB  1   IAB      Ectopic      Multiple  0   Live Births  4            Home Medications    Prior to Admission medications  Medication Sig Start Date End Date Taking? Authorizing Provider  predniSONE  (DELTASONE ) 20 MG tablet Take 2 tablets (40 mg total) by mouth daily with breakfast. 03/10/24  Yes Stuart Vernell Norris, PA-C    Family History Family History  Problem Relation Age of Onset   Diabetes Father    Diabetes Sister    Heart disease Paternal Uncle    Heart disease Paternal Grandmother    Other Neg Hx     Social History Social History[1]   Allergies   Patient has no known allergies.   Review of Systems Review of Systems Per HPI  Physical Exam Triage  Vital Signs ED Triage Vitals  Encounter Vitals Group     BP 03/10/24 1832 138/72     Girls Systolic BP Percentile --      Girls Diastolic BP Percentile --      Boys Systolic BP Percentile --      Boys Diastolic BP Percentile --      Pulse Rate 03/10/24 1832 88     Resp 03/10/24 1832 18     Temp 03/10/24 1832 98.3 F (36.8 C)     Temp Source 03/10/24 1832 Oral     SpO2 03/10/24 1832 99 %     Weight --      Height --      Head Circumference --      Peak Flow --      Pain Score 03/10/24 1834 8     Pain Loc --      Pain Education --      Exclude from Growth Chart --    No data found.  Updated Vital Signs BP 138/72 (BP Location: Right Arm)   Pulse 88   Temp 98.3 F (36.8 C) (Oral)   Resp 18   LMP 02/25/2024 (Exact Date)   SpO2 99%   Visual Acuity Right Eye Distance:   Left  Eye Distance:   Bilateral Distance:    Right Eye Near:   Left Eye Near:    Bilateral Near:     Physical Exam Vitals and nursing note reviewed.  Constitutional:      Appearance: Normal appearance. She is not ill-appearing.  HENT:     Head: Atraumatic.  Eyes:     Extraocular Movements: Extraocular movements intact.     Conjunctiva/sclera: Conjunctivae normal.  Cardiovascular:     Rate and Rhythm: Normal rate.  Pulmonary:     Effort: Pulmonary effort is normal.  Musculoskeletal:        General: Tenderness present. No swelling, deformity or signs of injury. Normal range of motion.     Cervical back: Normal range of motion and neck supple.     Comments: Diffuse tenderness to palpation to the dorsal left hand.  No bony deformity palpable, no edema, erythema  Skin:    General: Skin is warm and dry.     Findings: No bruising or erythema.  Neurological:     Mental Status: She is alert and oriented to person, place, and time.     Comments: Left hand neurovascularly intact  Psychiatric:        Mood and Affect: Mood normal.        Thought Content: Thought content normal.        Judgment: Judgment normal.      UC Treatments / Results  Labs (all labs ordered are listed, but only abnormal results are displayed) Labs Reviewed - No data to display  EKG   Radiology No results found.  Procedures Procedures (including critical care time)  Medications Ordered in UC Medications - No data to display  Initial Impression / Assessment and Plan / UC Course  I have reviewed the triage vital signs and the nursing notes.  Pertinent labs & imaging results that were available during my care of the patient were reviewed by me and considered in my medical decision making (see chart for details).     X-ray personally reviewed of the left hand without evidence of bony abnormality, awaiting radiology report for confirmation.  Suspect tendinitis or other inflammatory cause of pain.  Treat  with prednisone , RICE, over-the-counter  pain relievers as needed.  Return for worsening or unresolving symptoms.  Final Clinical Impressions(s) / UC Diagnoses   Final diagnoses:  Left hand pain     Discharge Instructions      In addition to the steroid, you may do warm Epsom salt soaks, ice, elevate, Tylenol  as needed for pain relief.  Follow-up with orthopedics if not resolving.    ED Prescriptions     Medication Sig Dispense Auth. Provider   predniSONE  (DELTASONE ) 20 MG tablet Take 2 tablets (40 mg total) by mouth daily with breakfast. 10 tablet Stuart Vernell Norris, PA-C      PDMP not reviewed this encounter.    [1]  Social History Tobacco Use   Smoking status: Every Day    Types: Cigarettes   Smokeless tobacco: Never  Vaping Use   Vaping status: Never Used  Substance Use Topics   Alcohol use: Yes    Comment: occasionally   Drug use: No     Stuart Vernell Norris, PA-C 03/10/24 1941  "

## 2024-03-10 NOTE — Discharge Instructions (Addendum)
 In addition to the steroid, you may do warm Epsom salt soaks, ice, elevate, Tylenol  as needed for pain relief.  Follow-up with orthopedics if not resolving.

## 2024-03-10 NOTE — ED Triage Notes (Addendum)
 Pain in left hand since yesterday.  No known injury.  Unable to use hand, hurts to stretch out all the way and finger tips feel numb.  States it feel like it is broken
